# Patient Record
Sex: Female | Born: 1970 | Race: White | Hispanic: No | Marital: Married | Smoking: Current every day smoker
Health system: Southern US, Community
[De-identification: ages and names within clinical notes are randomized; demographics above are authoritative.]

## PROBLEM LIST (undated history)

## (undated) DIAGNOSIS — G709 Myoneural disorder, unspecified: Secondary | ICD-10-CM

## (undated) DIAGNOSIS — E785 Hyperlipidemia, unspecified: Secondary | ICD-10-CM

## (undated) DIAGNOSIS — E039 Hypothyroidism, unspecified: Secondary | ICD-10-CM

## (undated) DIAGNOSIS — I491 Atrial premature depolarization: Secondary | ICD-10-CM

## (undated) DIAGNOSIS — K219 Gastro-esophageal reflux disease without esophagitis: Secondary | ICD-10-CM

## (undated) DIAGNOSIS — I10 Essential (primary) hypertension: Secondary | ICD-10-CM

## (undated) DIAGNOSIS — J449 Chronic obstructive pulmonary disease, unspecified: Secondary | ICD-10-CM

## (undated) DIAGNOSIS — N838 Other noninflammatory disorders of ovary, fallopian tube and broad ligament: Secondary | ICD-10-CM

## (undated) DIAGNOSIS — J45909 Unspecified asthma, uncomplicated: Secondary | ICD-10-CM

## (undated) DIAGNOSIS — F419 Anxiety disorder, unspecified: Secondary | ICD-10-CM

## (undated) HISTORY — PX: TUBAL LIGATION: SHX77

## (undated) HISTORY — DX: Other noninflammatory disorders of ovary, fallopian tube and broad ligament: N83.8

## (undated) HISTORY — DX: Anxiety disorder, unspecified: F41.9

## (undated) HISTORY — DX: Essential (primary) hypertension: I10

## (undated) HISTORY — DX: Myoneural disorder, unspecified: G70.9

## (undated) HISTORY — DX: Hyperlipidemia, unspecified: E78.5

## (undated) HISTORY — PX: CHOLECYSTECTOMY: SHX55

## (undated) HISTORY — PX: PARTIAL HYSTERECTOMY: SHX80

## (undated) HISTORY — DX: Chronic obstructive pulmonary disease, unspecified: J44.9

## (undated) HISTORY — DX: Unspecified asthma, uncomplicated: J45.909

## (undated) HISTORY — DX: Atrial premature depolarization: I49.1

## (undated) HISTORY — DX: Gastro-esophageal reflux disease without esophagitis: K21.9

## (undated) HISTORY — DX: Hypothyroidism, unspecified: E03.9

---

## 2002-01-09 HISTORY — PX: UPPER GASTROINTESTINAL ENDOSCOPY: SHX188

## 2012-05-30 DIAGNOSIS — M940 Chondrocostal junction syndrome [Tietze]: Secondary | ICD-10-CM

## 2012-05-30 DIAGNOSIS — K219 Gastro-esophageal reflux disease without esophagitis: Secondary | ICD-10-CM

## 2012-05-30 DIAGNOSIS — I10 Essential (primary) hypertension: Secondary | ICD-10-CM | POA: Insufficient documentation

## 2012-05-30 DIAGNOSIS — R450 Nervousness: Secondary | ICD-10-CM

## 2012-05-30 DIAGNOSIS — M25569 Pain in unspecified knee: Secondary | ICD-10-CM

## 2012-05-30 DIAGNOSIS — R0689 Other abnormalities of breathing: Secondary | ICD-10-CM | POA: Insufficient documentation

## 2012-05-30 DIAGNOSIS — R0600 Dyspnea, unspecified: Secondary | ICD-10-CM

## 2012-05-30 DIAGNOSIS — E1142 Type 2 diabetes mellitus with diabetic polyneuropathy: Secondary | ICD-10-CM

## 2012-05-30 HISTORY — DX: Pain in unspecified knee: M25.569

## 2012-05-30 HISTORY — DX: Chondrocostal junction syndrome (tietze): M94.0

## 2012-05-30 HISTORY — DX: Gastro-esophageal reflux disease without esophagitis: K21.9

## 2012-05-30 HISTORY — DX: Type 2 diabetes mellitus with diabetic polyneuropathy: E11.42

## 2012-05-30 HISTORY — DX: Essential (primary) hypertension: I10

## 2012-05-30 HISTORY — DX: Dyspnea, unspecified: R06.00

## 2012-05-30 HISTORY — DX: Nervousness: R45.0

## 2012-05-30 HISTORY — DX: Dyspnea, unspecified: R06.89

## 2014-01-09 HISTORY — PX: COLONOSCOPY: SHX174

## 2014-04-24 DIAGNOSIS — G8929 Other chronic pain: Secondary | ICD-10-CM

## 2014-04-24 HISTORY — DX: Other chronic pain: G89.29

## 2014-04-27 DIAGNOSIS — M542 Cervicalgia: Secondary | ICD-10-CM

## 2014-04-27 HISTORY — DX: Cervicalgia: M54.2

## 2014-11-03 DIAGNOSIS — G250 Essential tremor: Secondary | ICD-10-CM | POA: Insufficient documentation

## 2014-11-03 HISTORY — DX: Essential tremor: G25.0

## 2015-10-07 ENCOUNTER — Other Ambulatory Visit: Payer: Self-pay | Admitting: Family Medicine

## 2015-10-07 DIAGNOSIS — R109 Unspecified abdominal pain: Secondary | ICD-10-CM

## 2015-10-12 ENCOUNTER — Ambulatory Visit
Admission: RE | Admit: 2015-10-12 | Discharge: 2015-10-12 | Disposition: A | Discharge status: Home / Self Care | Payer: PRIVATE HEALTH INSURANCE | Source: Ambulatory Visit | Attending: Family Medicine | Admitting: Family Medicine

## 2015-10-12 DIAGNOSIS — R109 Unspecified abdominal pain: Secondary | ICD-10-CM

## 2015-10-12 MED ORDER — IOPAMIDOL (ISOVUE-300) INJECTION 61%
100.0000 mL | Freq: Once | INTRAVENOUS | Status: AC | PRN
  Administered 2015-10-12: 100 mL via INTRAVENOUS

## 2015-10-25 HISTORY — PX: ESOPHAGOGASTRODUODENOSCOPY: SHX1529

## 2016-03-15 DIAGNOSIS — F17200 Nicotine dependence, unspecified, uncomplicated: Secondary | ICD-10-CM

## 2016-03-15 HISTORY — DX: Nicotine dependence, unspecified, uncomplicated: F17.200

## 2016-05-16 HISTORY — PX: OTHER SURGICAL HISTORY: SHX169

## 2016-07-06 DIAGNOSIS — Z0289 Encounter for other administrative examinations: Secondary | ICD-10-CM | POA: Insufficient documentation

## 2016-07-06 HISTORY — DX: Encounter for other administrative examinations: Z02.89

## 2016-12-28 HISTORY — DX: Morbid (severe) obesity due to excess calories: E66.01

## 2017-02-22 ENCOUNTER — Other Ambulatory Visit: Payer: Self-pay

## 2017-02-22 DIAGNOSIS — N838 Other noninflammatory disorders of ovary, fallopian tube and broad ligament: Secondary | ICD-10-CM | POA: Insufficient documentation

## 2017-02-22 DIAGNOSIS — E119 Type 2 diabetes mellitus without complications: Secondary | ICD-10-CM | POA: Insufficient documentation

## 2017-02-22 DIAGNOSIS — R002 Palpitations: Secondary | ICD-10-CM

## 2017-02-22 HISTORY — DX: Palpitations: R00.2

## 2017-02-22 HISTORY — DX: Type 2 diabetes mellitus without complications: E11.9

## 2017-02-28 ENCOUNTER — Encounter: Payer: Self-pay | Admitting: Cardiology

## 2017-02-28 ENCOUNTER — Other Ambulatory Visit: Payer: Self-pay

## 2017-02-28 ENCOUNTER — Ambulatory Visit (INDEPENDENT_AMBULATORY_CARE_PROVIDER_SITE_OTHER): Payer: PRIVATE HEALTH INSURANCE | Admitting: Cardiology

## 2017-02-28 VITALS — BP 128/72 | HR 76 | Ht 68.0 in | Wt 232.0 lb

## 2017-02-28 DIAGNOSIS — Z01810 Encounter for preprocedural cardiovascular examination: Secondary | ICD-10-CM | POA: Diagnosis not present

## 2017-02-28 DIAGNOSIS — R0609 Other forms of dyspnea: Secondary | ICD-10-CM

## 2017-02-28 DIAGNOSIS — F17200 Nicotine dependence, unspecified, uncomplicated: Secondary | ICD-10-CM | POA: Diagnosis not present

## 2017-02-28 DIAGNOSIS — R0600 Dyspnea, unspecified: Secondary | ICD-10-CM

## 2017-02-28 HISTORY — DX: Dyspnea, unspecified: R06.00

## 2017-02-28 HISTORY — DX: Other forms of dyspnea: R06.09

## 2017-02-28 HISTORY — DX: Encounter for preprocedural cardiovascular examination: Z01.810

## 2017-02-28 NOTE — Patient Instructions (Signed)
Medication Instructions:  Your physician recommends that you continue on your current medications as directed. Please refer to the Current Medication list given to you today.  Labwork: None  Testing/Procedures: You had an EKG today.  Your physician has requested that you have a lexiscan myoview. For further information please visit [email or web address]. Please follow instruction sheet, as given.  Follow-Up: Your physician recommends that you schedule a follow-up appointment as needed if symptoms worsen or fail to improve.  Any Other Special Instructions Will Be Listed Below (If Applicable).     If you need a refill on your cardiac medications before your next appointment, please call your pharmacy.

## 2017-02-28 NOTE — Progress Notes (Signed)
Cardiology Office Note:    Date:  02/28/2017   ID:  Debra Wolf, DOB 22-Mar-1970, MRN [id number]  PCP:  [facility]  Cardiologist:  Jenean Lindau, MD   Referring MD: Ronita Hipps, MD    ASSESSMENT:    1. Tobacco use disorder   2. Morbid obesity ([place])   3. Pre-operative cardiovascular examination   4. DOE (dyspnea on exertion)    PLAN:    In order of problems listed above:  1. Discussed my findings with the patient at extensive length.  Preop risk stratification philosophy was discussed with her.  In view of risk factors for coronary artery disease and a sedentary lifestyle we will obtain a Lexiscan sestamibi.  Her symptoms are atypical for coronary artery disease.  If this test is negative then she has not at high risk for coronary events during the aforementioned surgery.  Meticulous hemodynamic monitoring will further reduce the risk of coronary events. 2. She will be seen in follow-up appointment on a as needed basis only.  I mentioned to her that she needs to be evaluated attention her diet especially in terms of obesity and the risks for cardiovascular conditions.  She agrees and plans to be meticulous about her diet and weight loss.  Risks of obesity explained. 3. I spent 5 minutes with the patient discussing solely about smoking. Smoking cessation was counseled. I suggested to the patient also different medications and pharmacological interventions. Patient is keen to try stopping on its own at this time. He will get back to me if he needs any further assistance in this matter.   Medication Adjustments/Labs and Tests Ordered: Current medicines are reviewed at length with the patient today.  Concerns regarding medicines are outlined above.  Orders Placed This Encounter  Procedures  . Myocardial Perfusion Imaging  . EKG 12-Lead   No orders of the defined types were placed in this encounter.    History of Present Illness:    Debra Wolf is a 47 y.o.  female who is being seen today for the evaluation of preop cardiovascular evaluation at the request of Ronita Hipps, MD.  Patient is a pleasant 47 year old female.  She mentions to me that she is on disability because of significant tremor.  She ambulates with a cane.  She tells me of some chest tightness at times not related to exertion.  This is more like a flutter-like sensation.  No radiation to any part of the body and is been happening for the past several weeks.  No orthopnea or PND.  She does have some dyspnea on exertion.  She mentions to me that she has had a history of essential hypertension and diabetes mellitus but now she has been told by her primary care physician that it is much better now and she does not need medications for it.  At the time of my evaluation, the patient is alert awake oriented and in no distress.  She plans to undergo surgery and is here for assessment preoperatively.  Past Medical History:  Diagnosis Date  . Benign essential hypertension   . COPD (chronic obstructive pulmonary disease) ([place])   . Dyslipidemia   . GERD (gastroesophageal reflux disease)   . Hypothyroid   . Mass of ovary     Past Surgical History:  Procedure Laterality Date  . PARTIAL HYSTERECTOMY    . removal of deep brain stimulator  05/16/2016   Dr. Phylliss Blakes at [facility]    Current Medications: Current  Meds  Medication Sig  . albuterol (PROVENTIL HFA;VENTOLIN HFA) 108 (90 Base) MCG/ACT inhaler Inhale into the lungs.  . budesonide-formoterol (SYMBICORT) 160-4.5 MCG/ACT inhaler INHALE TWO PUFFS TWICE DAILY  . citalopram (CELEXA) 20 MG tablet Take by mouth.  . cyclobenzaprine (FLEXERIL) 10 MG tablet Take 10 mg by mouth.  . diclofenac sodium (VOLTAREN) 1 % GEL Apply topically.  . fluticasone (FLONASE) 50 MCG/ACT nasal spray Use 2 sprays in each nostril once daily  . furosemide (LASIX) 20 MG tablet TAKE ONE TABLET BY MOUTH ONCE DAILY AS NEEDED FOR SWELLING  . gabapentin (NEURONTIN) 300 MG  capsule Take 900 mg by mouth 3 (three) times daily.  Marland Kitchen HYDROcodone-acetaminophen (NORCO/VICODIN) 5-325 MG tablet Take by mouth.  . levothyroxine (SYNTHROID, LEVOTHROID) 150 MCG tablet Take 150 mcg by mouth daily.  Marland Kitchen levothyroxine (SYNTHROID, LEVOTHROID) 175 MCG tablet Take by mouth.  . nitroGLYCERIN (NITROSTAT) 0.4 MG SL tablet DISSOLVE 1 TABLET UNDER THE TONGUE EVERY 5 MINUTES AS NEEDED FOR CHEST PAIN. DO NOT EXCEED A TOTAL OF 3 DOSES IN 15 MINUTES.  . pantoprazole (PROTONIX) 40 MG tablet Take by mouth.  . Vitamin D, Ergocalciferol, (DRISDOL) 50000 units CAPS capsule TAKE ONE CAPSULE BY MOUTH ONCE A WEEK     Allergies:   Penicillins; Vancomycin; and Cephalexin   Social History   Socioeconomic History  . Marital status: Married    Spouse name: None  . Number of children: None  . Years of education: None  . Highest education level: None  Social Needs  . Financial resource strain: None  . Food insecurity - worry: None  . Food insecurity - inability: None  . Transportation needs - medical: None  . Transportation needs - non-medical: None  Occupational History  . None  Tobacco Use  . Smoking status: Current Every Day Smoker    Packs/day: 1.00  . Smokeless tobacco: Never Used  Substance and Sexual Activity  . Alcohol use: No    Frequency: Never  . Drug use: No  . Sexual activity: None  Other Topics Concern  . None  Social History Narrative  . None     Family History: The patient's family history includes Bladder Cancer in her mother; Lung cancer in her father; Ovarian cancer in her sister.  ROS:   Please see the history of present illness.    All other systems reviewed and are negative.  EKGs/Labs/Other Studies Reviewed:    The following studies were reviewed today: EKG reveals sinus rhythm and nonspecific ST-T changes other records were reviewed.   Recent Labs: No results found for requested labs within last 8760 hours.  Recent Lipid Panel No results found for:  CHOL, TRIG, HDL, CHOLHDL, VLDL, LDLCALC, LDLDIRECT  Physical Exam:    VS:  BP 128/72 (BP Location: Left Arm, Patient Position: Sitting, Cuff Size: Normal)   Pulse 76   Ht 5\' 8"  (1.727 m)   Wt 232 lb (105.2 kg)   SpO2 99%   BMI 35.28 kg/m     Wt Readings from Last 3 Encounters:  02/28/17 232 lb (105.2 kg)     GEN: Patient is in no acute distress HEENT: Normal NECK: No JVD; No carotid bruits LYMPHATICS: No lymphadenopathy CARDIAC: S1 S2 regular, 2/6 systolic murmur at the apex. RESPIRATORY:  Clear to auscultation without rales, wheezing or rhonchi  ABDOMEN: Soft, non-tender, non-distended MUSCULOSKELETAL:  No edema; No deformity  SKIN: Warm and dry NEUROLOGIC:  Alert and oriented x 3 PSYCHIATRIC:  Normal affect    Signed,  Jenean Lindau, MD  02/28/2017 1:58 PM    [facility]

## 2017-03-02 ENCOUNTER — Telehealth: Payer: Self-pay | Admitting: Cardiology

## 2017-03-02 DIAGNOSIS — Z01810 Encounter for preprocedural cardiovascular examination: Secondary | ICD-10-CM | POA: Diagnosis not present

## 2017-03-02 NOTE — Telephone Encounter (Signed)
Faxed to their office.

## 2017-03-02 NOTE — Telephone Encounter (Signed)
Patient needs results of CT also sent to [facility] in [place]: [place]. Their phone number is [phone]. They need it for surgery on Tuesday

## 2017-03-05 ENCOUNTER — Telehealth: Payer: Self-pay

## 2017-03-05 ENCOUNTER — Other Ambulatory Visit: Payer: Self-pay

## 2017-03-05 DIAGNOSIS — Z01810 Encounter for preprocedural cardiovascular examination: Secondary | ICD-10-CM

## 2017-03-05 DIAGNOSIS — R0609 Other forms of dyspnea: Secondary | ICD-10-CM

## 2017-03-05 NOTE — Telephone Encounter (Signed)
Patient called requesting the results of her cardiac testing at [facility]. Informed of results and that they were faxed to [facility] in [place] on Friday.

## 2017-07-08 DIAGNOSIS — E039 Hypothyroidism, unspecified: Secondary | ICD-10-CM | POA: Insufficient documentation

## 2017-07-08 DIAGNOSIS — J441 Chronic obstructive pulmonary disease with (acute) exacerbation: Secondary | ICD-10-CM | POA: Insufficient documentation

## 2017-07-08 DIAGNOSIS — R0902 Hypoxemia: Secondary | ICD-10-CM | POA: Insufficient documentation

## 2017-07-08 HISTORY — DX: Hypoxemia: R09.02

## 2017-07-08 HISTORY — DX: Chronic obstructive pulmonary disease with (acute) exacerbation: J44.1

## 2017-07-18 DIAGNOSIS — R079 Chest pain, unspecified: Secondary | ICD-10-CM

## 2017-07-18 HISTORY — DX: Chest pain, unspecified: R07.9

## 2017-07-19 ENCOUNTER — Other Ambulatory Visit: Payer: Self-pay

## 2017-07-19 DIAGNOSIS — I83893 Varicose veins of bilateral lower extremities with other complications: Secondary | ICD-10-CM

## 2017-10-17 ENCOUNTER — Encounter: Payer: Self-pay | Admitting: Vascular Surgery

## 2017-10-17 ENCOUNTER — Ambulatory Visit (HOSPITAL_COMMUNITY)
Admission: RE | Admit: 2017-10-17 | Discharge: 2017-10-17 | Disposition: A | Discharge status: Home / Self Care | Payer: 59 | Source: Ambulatory Visit | Attending: Vascular Surgery | Admitting: Vascular Surgery

## 2017-10-17 ENCOUNTER — Ambulatory Visit (INDEPENDENT_AMBULATORY_CARE_PROVIDER_SITE_OTHER): Payer: PRIVATE HEALTH INSURANCE | Admitting: Vascular Surgery

## 2017-10-17 VITALS — BP 136/75 | HR 60 | Temp 97.6°F | Resp 16 | Ht 68.0 in | Wt 257.0 lb

## 2017-10-17 DIAGNOSIS — I83893 Varicose veins of bilateral lower extremities with other complications: Secondary | ICD-10-CM

## 2017-10-17 DIAGNOSIS — I83813 Varicose veins of bilateral lower extremities with pain: Secondary | ICD-10-CM

## 2017-10-17 NOTE — Progress Notes (Signed)
REASON FOR CONSULT:    Bilateral painful varicose veins.  The consult is requested by Dr. Helene Kelp.  HPI:   Debra Wolf is a pleasant 47 y.o. female, who is referred with painful varicose veins of both lower extremities.  I have reviewed the records from the referring office.  The patient was seen on 07/05/2017 with leg swelling.  She had evidence of venous insufficiency and sent for vascular consultation.  Patient is also followed with COPD.  On my history, the patient describes pain in the hip and thigh which is described as burning which is aggravated by standing and relieved somewhat with elevation.  I do not get any classic history of claudication in either leg.  She denies any history of back pain.  She is had no previous history of DVT or phlebitis.  She has worked on her feet for many years but currently is out on disability but does spend a fair amount of time on her feet.  She has tried thigh-high compression stockings but these actually made her symptoms worse.  Past Medical History:  Diagnosis Date  . Benign essential hypertension   . COPD (chronic obstructive pulmonary disease) ([place])   . Dyslipidemia   . GERD (gastroesophageal reflux disease)   . Hypothyroid   . Mass of ovary     Family History  Problem Relation Age of Onset  . Bladder Cancer Mother   . Lung cancer Father   . Ovarian cancer Sister     SOCIAL HISTORY: Social History   Socioeconomic History  . Marital status: Married    Spouse name: Not on file  . Number of children: Not on file  . Years of education: Not on file  . Highest education level: Not on file  Occupational History  . Not on file  Social Needs  . Financial resource strain: Not on file  . Food insecurity:    Worry: Not on file    Inability: Not on file  . Transportation needs:    Medical: Not on file    Non-medical: Not on file  Tobacco Use  . Smoking status: Current Every Day Smoker    Packs/day: 1.00  . Smokeless tobacco: Never  Used  Substance and Sexual Activity  . Alcohol use: No    Frequency: Never  . Drug use: No  . Sexual activity: Not on file  Lifestyle  . Physical activity:    Days per week: Not on file    Minutes per session: Not on file  . Stress: Not on file  Relationships  . Social connections:    Talks on phone: Not on file    Gets together: Not on file    Attends religious service: Not on file    Active member of club or organization: Not on file    Attends meetings of clubs or organizations: Not on file    Relationship status: Not on file  . Intimate partner violence:    Fear of current or ex partner: Not on file    Emotionally abused: Not on file    Physically abused: Not on file    Forced sexual activity: Not on file  Other Topics Concern  . Not on file  Social History Narrative  . Not on file    Allergies  Allergen Reactions  . Penicillins Hives and Swelling    Tolerated amoxicillin course in 2014 Tolerated amoxicillin course in 2014   . Vancomycin Anaphylaxis  . Cephalexin Itching    Current  Outpatient Medications  Medication Sig Dispense Refill  . albuterol (PROVENTIL HFA;VENTOLIN HFA) 108 (90 Base) MCG/ACT inhaler Inhale into the lungs.    . budesonide-formoterol (SYMBICORT) 160-4.5 MCG/ACT inhaler INHALE TWO PUFFS TWICE DAILY    . citalopram (CELEXA) 20 MG tablet Take by mouth.    . cyclobenzaprine (FLEXERIL) 10 MG tablet Take 10 mg by mouth.    . diclofenac sodium (VOLTAREN) 1 % GEL Apply topically.    . fluticasone (FLONASE) 50 MCG/ACT nasal spray Use 2 sprays in each nostril once daily    . furosemide (LASIX) 20 MG tablet TAKE ONE TABLET BY MOUTH ONCE DAILY AS NEEDED FOR SWELLING    . gabapentin (NEURONTIN) 300 MG capsule Take 900 mg by mouth 3 (three) times daily.    Marland Kitchen levothyroxine (SYNTHROID, LEVOTHROID) 150 MCG tablet Take 150 mcg by mouth daily.  2  . nitroGLYCERIN (NITROSTAT) 0.4 MG SL tablet DISSOLVE 1 TABLET UNDER THE TONGUE EVERY 5 MINUTES AS NEEDED FOR  CHEST PAIN. DO NOT EXCEED A TOTAL OF 3 DOSES IN 15 MINUTES.    . pantoprazole (PROTONIX) 40 MG tablet Take by mouth.    Marland Kitchen HYDROcodone-acetaminophen (NORCO/VICODIN) 5-325 MG tablet Take by mouth.    . levothyroxine (SYNTHROID, LEVOTHROID) 175 MCG tablet Take by mouth.    . Vitamin D, Ergocalciferol, (DRISDOL) 50000 units CAPS capsule TAKE ONE CAPSULE BY MOUTH ONCE A WEEK     No current facility-administered medications for this visit.     REVIEW OF SYSTEMS:  [X]  denotes positive finding, [ ]  denotes negative finding Cardiac  Comments:  Chest pain or chest pressure:    Shortness of breath upon exertion: x   Short of breath when lying flat:    Irregular heart rhythm:        Vascular    Pain in calf, thigh, or hip brought on by ambulation: x   Pain in feet at night that wakes you up from your sleep:  x   Blood clot in your veins:    Leg swelling:  x       Pulmonary    Oxygen at home:    Productive cough:     Wheezing:  x       Neurologic    Sudden weakness in arms or legs:     Sudden numbness in arms or legs:     Sudden onset of difficulty speaking or slurred speech:    Temporary loss of vision in one eye:     Problems with dizziness:         Gastrointestinal    Blood in stool:     Vomited blood:         Genitourinary    Burning when urinating:     Blood in urine:        Psychiatric    Major depression:         Hematologic    Bleeding problems:    Problems with blood clotting too easily:        Skin    Rashes or ulcers:        Constitutional    Fever or chills:     PHYSICAL EXAM:   Vitals:   10/17/17 1234  BP: 136/75  Pulse: 60  Resp: 16  Temp: 97.6 F (36.4 C)  SpO2: 99%  Weight: 257 lb (116.6 kg)  Height: 5\' 8"  (1.727 m)    GENERAL: The patient is a well-nourished female, in no acute distress. The vital signs are documented  above. CARDIAC: There is a regular rate and rhythm.  VASCULAR: I do not detect carotid bruits. She has palpable femoral and  dorsalis pedis pulses bilaterally. She has biphasic dorsalis pedis and posterior tibial signals bilaterally. She has mild bilateral lower extremity swelling with some spider veins bilaterally.  There is no significant hyperpigmentation. PULMONARY: There is good air exchange bilaterally without wheezing or rales. ABDOMEN: Soft and non-tender with normal pitched bowel sounds.  MUSCULOSKELETAL: There are no major deformities or cyanosis. NEUROLOGIC: No focal weakness or paresthesias are detected. SKIN: There are no ulcers or rashes noted. PSYCHIATRIC: The patient has a normal affect.  DATA:    VENOUS DUPLEX: I have independently interpreted her venous duplex scan today.  On the right side there is no evidence of DVT or superficial thrombophlebitis.  There is no significant deep venous reflux.  There is reflux in the great saphenous vein in the proximal calf to the distal thigh.  The vein is moderately dilated here.  There is incompetence of the saphenofemoral junction.  On the left side there is no evidence of DVT or superficial thrombophlebitis.  There is no significant reflux in the superficial venous system on the left.  There is no significant deep venous reflux.   ASSESSMENT & PLAN:   CHRONIC VENOUS INSUFFICIENCY: I reassured the patient that she has no evidence of arterial insufficiency.  I think her symptoms are partly related to her venous insufficiency.  The burning pain she describes with standing and the aching heavy feeling in her leg certainly could fit with venous disease.  However some of her pain radiates to her hip is likely unrelated to this.  Regardless we have discussed conservative treatment for her chronic venous insufficiency.  We have discussed the importance of intermittent leg elevation the proper positioning for this.  I have written her a prescription for knee-high compression stockings with a gradient of 15 to 20 mmHg.  I have encouraged her to avoid prolonged sitting  and standing.  I encouraged her to exercise.  Specifically walking and water aerobics are especially helpful.  We have also discussed the importance of weight management.  If her symptoms progress in the future then certainly we could repeat her duplex.  We could also consider thigh-high stockings with a titer gradient.  I will see her back as needed.   Deitra Mayo [facility] [phone]

## 2019-02-03 ENCOUNTER — Other Ambulatory Visit: Payer: Self-pay

## 2019-02-03 ENCOUNTER — Ambulatory Visit (INDEPENDENT_AMBULATORY_CARE_PROVIDER_SITE_OTHER): Payer: PRIVATE HEALTH INSURANCE | Admitting: Cardiology

## 2019-02-03 ENCOUNTER — Encounter: Payer: Self-pay | Admitting: Cardiology

## 2019-02-03 ENCOUNTER — Ambulatory Visit (INDEPENDENT_AMBULATORY_CARE_PROVIDER_SITE_OTHER): Payer: PRIVATE HEALTH INSURANCE

## 2019-02-03 VITALS — BP 120/80 | HR 77 | Ht 68.0 in | Wt 244.0 lb

## 2019-02-03 DIAGNOSIS — R0600 Dyspnea, unspecified: Secondary | ICD-10-CM

## 2019-02-03 DIAGNOSIS — R002 Palpitations: Secondary | ICD-10-CM

## 2019-02-03 DIAGNOSIS — I10 Essential (primary) hypertension: Secondary | ICD-10-CM | POA: Diagnosis not present

## 2019-02-03 MED ORDER — NITROGLYCERIN 0.4 MG SL SUBL: SUBLINGUAL_TABLET | SUBLINGUAL | 3 refills | Status: DC

## 2019-02-03 MED ORDER — FUROSEMIDE 20 MG PO TABS: 20.0000 mg | ORAL_TABLET | Freq: Every day | ORAL | 1 refills | Status: DC

## 2019-02-03 MED ORDER — POTASSIUM CHLORIDE CRYS ER 20 MEQ PO TBCR: 20.0000 meq | EXTENDED_RELEASE_TABLET | Freq: Every day | ORAL | 1 refills | Status: DC

## 2019-02-03 NOTE — Patient Instructions (Signed)
Medication Instructions:  Your physician has recommended you make the following change in your medication:  START: Furosemide 20 mg Take 1 tab daily START: Potassium 20 meq Take 1 tab daily  Nitroglycerin was refilled  *If you need a refill on your cardiac medications before your next appointment, please call your pharmacy*  Lab Work: Your physician recommends that you return for lab work in: TODAY BMP,CBC,TSH,Magnesium  If you have labs (blood work) drawn today and your tests are completely normal, you will receive your results only by: Marland Kitchen MyChart Message (if you have MyChart) OR . A paper copy in the mail If you have any lab test that is abnormal or we need to change your treatment, we will call you to review the results.  Testing/Procedures: A zio monitor was ordered today. It will remain on for 7 days. You will then return monitor and event diary in provided box. It takes 1-2 weeks for report to be downloaded and returned to [place]. We will call you with the results. If monitor falls off or has orange flashing light, please call [employer] for further instructions.   Your physician has requested that you have an echocardiogram. Echocardiography is a painless test that uses sound waves to create images of your heart. It provides your doctor with information about the size and shape of your heart and how well your heart's chambers and valves are working. This procedure takes approximately one hour. There are no restrictions for this procedure.    Follow-Up: At [facility], you and your health needs are our priority.  As part of our continuing mission to provide you with exceptional heart care, we have created designated Provider Care Teams.  These Care Teams include your primary Cardiologist (physician) and Advanced Practice Providers (APPs -  Physician Assistants and Nurse Practitioners) who all work together to provide you with the care you need, when you need it.  Your next appointment:   1  month(s)  The format for your next appointment:   In Person  Provider:   Berniece Salines, DO  Other Instructions

## 2019-02-03 NOTE — Progress Notes (Signed)
Cardiology Office Note:    Date:  02/03/2019   ID:  Debra Wolf, DOB March 05, 1970, MRN [id number]  PCP:  [facility]  Cardiologist:  Berniece Salines, DO  Electrophysiologist:  None   Referring MD: Ronita Hipps, MD   Chief Complaint  Patient presents with  . Chest Pain  Patient was referred for chest pain shortness of breath.  History of Present Illness:    Debra Wolf is a 49 y.o. female with a hx of hypertension, dyslipidemia, hypothyroidism presents today to be evaluated for shortness of breath, palpitations and chest pain.  Patient tells me that over the last few weeks she has been experiencing significant shortness of breath on exertion.  She notes that she is unable to do her daily activities as she gets short of breath and has to stop any activity to be able to catch her breath at rest.  She states that few weeks ago she was experiencing right shoulder pain which she was told that this was rotator cuff but now she has been having left shoulder pain.  She does not really express any characteristics of a certain chest pain but just notes that the pain is actually in her left shoulder.  In addition what she tells me is that she has been experiencing significant palpitations.  She describes the palpitations abrupt onset of fast heartbeat which lasts for about 2 to 3 hours on onset.  She notes during this time she is very tired and feel that her shortness of breath get worse and he does have some lightheadedness.  After this episode she tells me that she significantly feels as if she has no energy.  Past Medical History:  Diagnosis Date  . Benign essential hypertension   . COPD (chronic obstructive pulmonary disease) ([place])   . Dyslipidemia   . GERD (gastroesophageal reflux disease)   . Hypothyroid   . Mass of ovary     Past Surgical History:  Procedure Laterality Date  . PARTIAL HYSTERECTOMY    . removal of deep brain stimulator  05/16/2016   Dr. Phylliss Blakes at [facility]     Current Medications: Current Meds  Medication Sig  . albuterol (PROVENTIL HFA;VENTOLIN HFA) 108 (90 Base) MCG/ACT inhaler Inhale into the lungs.  . budesonide-formoterol (SYMBICORT) 160-4.5 MCG/ACT inhaler INHALE TWO PUFFS TWICE DAILY  . cyclobenzaprine (FLEXERIL) 10 MG tablet Take 10 mg by mouth.  . fluticasone (FLONASE) 50 MCG/ACT nasal spray Use 2 sprays in each nostril once daily  . levothyroxine (SYNTHROID, LEVOTHROID) 150 MCG tablet Take 150 mcg by mouth daily.  . nitroGLYCERIN (NITROSTAT) 0.4 MG SL tablet DISSOLVE 1 TABLET UNDER THE TONGUE EVERY 5 MINUTES AS NEEDED FOR CHEST PAIN. DO NOT EXCEED A TOTAL OF 3 DOSES IN 15 MINUTES.  . pantoprazole (PROTONIX) 40 MG tablet Take by mouth.  . Vitamin D, Ergocalciferol, (DRISDOL) 50000 units CAPS capsule TAKE ONE CAPSULE BY MOUTH ONCE A WEEK  . [DISCONTINUED] furosemide (LASIX) 20 MG tablet TAKE ONE TABLET BY MOUTH ONCE DAILY AS NEEDED FOR SWELLING  . [DISCONTINUED] nitroGLYCERIN (NITROSTAT) 0.4 MG SL tablet DISSOLVE 1 TABLET UNDER THE TONGUE EVERY 5 MINUTES AS NEEDED FOR CHEST PAIN. DO NOT EXCEED A TOTAL OF 3 DOSES IN 15 MINUTES.     Allergies:   Penicillins, Vancomycin, and Cephalexin   Social History   Socioeconomic History  . Marital status: Married    Spouse name: Not on file  . Number of children: Not on file  . Years of  education: Not on file  . Highest education level: Not on file  Occupational History  . Not on file  Tobacco Use  . Smoking status: Current Every Day Smoker    Packs/day: 1.00  . Smokeless tobacco: Never Used  Substance and Sexual Activity  . Alcohol use: No  . Drug use: No  . Sexual activity: Not on file  Other Topics Concern  . Not on file  Social History Narrative  . Not on file   Social Determinants of Health   Financial Resource Strain:   . Difficulty of Paying Living Expenses: Not on file  Food Insecurity:   . Worried About Charity fundraiser in the Last Year: Not on file  . Ran Out of  Food in the Last Year: Not on file  Transportation Needs:   . Lack of Transportation (Medical): Not on file  . Lack of Transportation (Non-Medical): Not on file  Physical Activity:   . Days of Exercise per Week: Not on file  . Minutes of Exercise per Session: Not on file  Stress:   . Feeling of Stress : Not on file  Social Connections:   . Frequency of Communication with Friends and Family: Not on file  . Frequency of Social Gatherings with Friends and Family: Not on file  . Attends Religious Services: Not on file  . Active Member of Clubs or Organizations: Not on file  . Attends Archivist Meetings: Not on file  . Marital Status: Not on file     Family History: The patient's family history includes Bladder Cancer in her mother; Congestive Heart Failure in her father and mother; Lung cancer in her father; Ovarian cancer in her sister.  ROS:   Review of Systems  Constitution: Negative for decreased appetite, fever and weight gain.  HENT: Negative for congestion, ear discharge, hoarse voice and sore throat.   Eyes: Negative for discharge, redness, vision loss in right eye and visual halos.  Cardiovascular: Reports chest pain, dyspnea on exertion and palpitations.  Negative for, leg swelling, orthopnea. Respiratory: Negative for cough, hemoptysis, shortness of breath and snoring.   Endocrine: Negative for heat intolerance and polyphagia.  Hematologic/Lymphatic: Negative for bleeding problem. Does not bruise/bleed easily.  Skin: Negative for flushing, nail changes, rash and suspicious lesions.  Musculoskeletal: Negative for arthritis, joint pain, muscle cramps, myalgias, neck pain and stiffness.  Gastrointestinal: Negative for abdominal pain, bowel incontinence, diarrhea and excessive appetite.  Genitourinary: Negative for decreased libido, genital sores and incomplete emptying.  Neurological: Negative for brief paralysis, focal weakness, headaches and loss of balance.   Psychiatric/Behavioral: Negative for altered mental status, depression and suicidal ideas.  Allergic/Immunologic: Negative for HIV exposure and persistent infections.    EKGs/Labs/Other Studies Reviewed:    The following studies were reviewed today:   EKG:  The ekg ordered today demonstrates sinus rhythm, heart rate 79 bpm, prior EKG for comparison.  In June 2019 the patient did undergo her echocardiogram with [facility] which reported normal LVEF 55 to 60%.  Trace mitral vegetation.  Normal left atrium.  Normal right atrium.  Normal right ventricular function.  No aortic stenosis or regurgitation.  Pharmacologic nuclear stress test done in February 2019 at [facility] was reported as normal with no reversible ischemia or infarction.  Recent Labs: No results found for requested labs within last 8760 hours.  Recent Lipid Panel No results found for: CHOL, TRIG, HDL, CHOLHDL, VLDL, LDLCALC, LDLDIRECT  Physical Exam:    VS:  BP 120/80 (BP Location: Left Arm, Patient Position: Sitting, Cuff Size: Large)   Pulse 77   Ht 5\' 8"  (1.727 m)   Wt 244 lb (110.7 kg)   SpO2 93%   BMI 37.10 kg/m     Wt Readings from Last 3 Encounters:  02/03/19 244 lb (110.7 kg)  10/17/17 257 lb (116.6 kg)  02/28/17 232 lb (105.2 kg)     GEN: Well nourished, well developed in no acute distress HEENT: Normal NECK: No JVD; No carotid bruits LYMPHATICS: No lymphadenopathy CARDIAC: S1S2 noted,RRR, no murmurs, rubs, gallops RESPIRATORY:  Clear to auscultation without rales, wheezing or rhonchi  ABDOMEN: Soft, non-tender, non-distended, +bowel sounds, no guarding. EXTREMITIES: No edema, No cyanosis, no clubbing MUSCULOSKELETAL:  No edema; No deformity  SKIN: Warm and dry NEUROLOGIC:  Alert and oriented x 3, non-focal PSYCHIATRIC:  Normal affect, good insight  ASSESSMENT:    1. Dyspnea, unspecified type   2. Palpitations   3. Essential hypertension    PLAN:    1.  I would like to  rule out a cardiovascular etiology of this palpitation, therefore at this time I would like to placed a zio patch for 7 days. In additon a transthoracic echocardiogram will be ordered to assess LV/RV function and any structural abnormalities. Once these testing have been performed amd reviewed further reccomendations will be made. For now, I do reccomend that the patient goes to the nearest ED if  symptoms recur.  2.  Also concerned that diastolic dysfunction may be playing a role.  Therefore I am going to give the patient Lasix 20 mg daily with potassium chloride 20 mEq daily.  Hopefully this will help with her symptoms.  3.  I was able to review her pharmacologic stress test which was done in February 2019-this test was reported as normal.  Therefore now.  Nitroglycerin will be ordered and I am hoping that we are able to understand the etiology of her symptoms from the monitor as well as her echocardiogram.  For now we will hold off on redoing a stress test and reassess clinically after other testing have been done.  4.  Blood work will be done today for BMP, CBC, TSH.  The patient is in agreement with the above plan. The patient left the office in stable condition.  The patient will follow up in 1 month or sooner if needed   Medication Adjustments/Labs and Tests Ordered: Current medicines are reviewed at length with the patient today.  Concerns regarding medicines are outlined above.  Orders Placed This Encounter  Procedures  . Basic Metabolic Panel (BMET)  . Magnesium  . CBC  . TSH  . LONG TERM MONITOR (3-14 DAYS)  . EKG 12-Lead  . ECHOCARDIOGRAM COMPLETE   Meds ordered this encounter  Medications  . furosemide (LASIX) 20 MG tablet    Sig: Take 1 tablet (20 mg total) by mouth daily.    Dispense:  90 tablet    Refill:  1  . potassium chloride SA (KLOR-CON) 20 MEQ tablet    Sig: Take 1 tablet (20 mEq total) by mouth daily.    Dispense:  90 tablet    Refill:  1  . nitroGLYCERIN  (NITROSTAT) 0.4 MG SL tablet    Sig: DISSOLVE 1 TABLET UNDER THE TONGUE EVERY 5 MINUTES AS NEEDED FOR CHEST PAIN. DO NOT EXCEED A TOTAL OF 3 DOSES IN 15 MINUTES.    Dispense:  30 tablet    Refill:  3  Patient Instructions  Medication Instructions:  Your physician has recommended you make the following change in your medication:  START: Furosemide 20 mg Take 1 tab daily START: Potassium 20 meq Take 1 tab daily  Nitroglycerin was refilled  *If you need a refill on your cardiac medications before your next appointment, please call your pharmacy*  Lab Work: Your physician recommends that you return for lab work in: TODAY BMP,CBC,TSH,Magnesium  If you have labs (blood work) drawn today and your tests are completely normal, you will receive your results only by: Marland Kitchen MyChart Message (if you have MyChart) OR . A paper copy in the mail If you have any lab test that is abnormal or we need to change your treatment, we will call you to review the results.  Testing/Procedures: A zio monitor was ordered today. It will remain on for 7 days. You will then return monitor and event diary in provided box. It takes 1-2 weeks for report to be downloaded and returned to [place]. We will call you with the results. If monitor falls off or has orange flashing light, please call Zio for further instructions.   Your physician has requested that you have an echocardiogram. Echocardiography is a painless test that uses sound waves to create images of your heart. It provides your doctor with information about the size and shape of your heart and how well your heart's chambers and valves are working. This procedure takes approximately one hour. There are no restrictions for this procedure.    Follow-Up: At [facility], you and your health needs are our priority.  As part of our continuing mission to provide you with exceptional heart care, we have created designated Provider Care Teams.  These Care Teams include  your primary Cardiologist (physician) and Advanced Practice Providers (APPs -  Physician Assistants and Nurse Practitioners) who all work together to provide you with the care you need, when you need it.  Your next appointment:   1 month(s)  The format for your next appointment:   In Person  Provider:   Berniece Salines, DO  Other Instructions      Adopting a Healthy Lifestyle.  Know what a healthy weight is for you (roughly BMI <25) and aim to maintain this   Aim for 7+ servings of fruits and vegetables daily   65-80+ fluid ounces of water or unsweet tea for healthy kidneys   Limit to max 1 drink of alcohol per day; avoid smoking/tobacco   Limit animal fats in diet for cholesterol and heart health - choose grass fed whenever available   Avoid highly processed foods, and foods high in saturated/trans fats   Aim for low stress - take time to unwind and care for your mental health   Aim for 150 min of moderate intensity exercise weekly for heart health, and weights twice weekly for bone health   Aim for 7-9 hours of sleep daily   When it comes to diets, agreement about the perfect plan isnt easy to find, even among the experts. Experts at the [place] developed an idea known as the Healthy Eating Plate. Just imagine a plate divided into logical, healthy portions.   The emphasis is on diet quality:   Load up on vegetables and fruits - one-half of your plate: Aim for color and variety, and remember that potatoes dont count.   Go for whole grains - one-quarter of your plate: Whole wheat, barley, wheat berries, quinoa, oats, brown rice, and foods  made with them. If you want pasta, go with whole wheat pasta.   Protein power - one-quarter of your plate: Fish, chicken, beans, and nuts are all healthy, versatile protein sources. Limit red meat.   The diet, however, does go beyond the plate, offering a few other suggestions.   Use healthy plant oils, such as  olive, canola, soy, corn, sunflower and peanut. Check the labels, and avoid partially hydrogenated oil, which have unhealthy trans fats.   If youre thirsty, drink water. Coffee and tea are good in moderation, but skip sugary drinks and limit milk and dairy products to one or two daily servings.   The type of carbohydrate in the diet is more important than the amount. Some sources of carbohydrates, such as vegetables, fruits, whole grains, and beans-are healthier than others.   Finally, stay active  Signed, Berniece Salines, DO  02/03/2019 3:52 PM    [facility]

## 2019-02-04 ENCOUNTER — Ambulatory Visit: Payer: 59 | Admitting: Cardiology

## 2019-02-04 LAB — BASIC METABOLIC PANEL
BUN/Creatinine Ratio: 11 (ref 9–23)
BUN: 11 mg/dL (ref 6–24)
CO2: 26 mmol/L (ref 20–29)
Calcium: 9.4 mg/dL (ref 8.7–10.2)
Chloride: 99 mmol/L (ref 96–106)
Creatinine, Ser: 1 mg/dL (ref 0.57–1.00)
GFR calc Af Amer: 76 mL/min/{1.73_m2} (ref >59)
GFR calc non Af Amer: 66 mL/min/{1.73_m2} (ref >59)
Glucose: 83 mg/dL (ref 65–99)
Potassium: 4.5 mmol/L (ref 3.5–5.2)
Sodium: 141 mmol/L (ref 134–144)

## 2019-02-04 LAB — MAGNESIUM: Magnesium: 2.3 mg/dL (ref 1.6–2.3)

## 2019-02-04 LAB — CBC
Hematocrit: 40.6 % (ref 34.0–46.6)
Hemoglobin: 13.5 g/dL (ref 11.1–15.9)
MCH: 28.8 pg (ref 26.6–33.0)
MCHC: 33.3 g/dL (ref 31.5–35.7)
MCV: 87 fL (ref 79–97)
Platelets: 286 10*3/uL (ref 150–450)
RBC: 4.68 x10E6/uL (ref 3.77–5.28)
RDW: 13.3 % (ref 11.7–15.4)
WBC: 12.2 10*3/uL — ABNORMAL HIGH (ref 3.4–10.8)

## 2019-02-04 LAB — TSH: TSH: 0.581 u[IU]/mL (ref 0.450–4.500)

## 2019-02-05 ENCOUNTER — Telehealth: Payer: Self-pay | Admitting: *Deleted

## 2019-02-05 NOTE — Telephone Encounter (Signed)
-----   Message from Berniece Salines, DO sent at 02/05/2019  8:31 AM EST ----- Mildly elevated WBC.  Otherwise normal labs

## 2019-02-05 NOTE — Telephone Encounter (Signed)
Telephone call to patient. Left message with lab results and to call with any questions.

## 2019-02-05 NOTE — Telephone Encounter (Signed)
The patient has been notified of the result and verbalized understanding.  All questions (if any) were answered. Antonieta Iba, RN 02/05/2019 2:27 PM

## 2019-02-05 NOTE — Telephone Encounter (Signed)
Patient returning call.

## 2019-02-05 NOTE — Telephone Encounter (Signed)
Telephone call to patient. She could not hear the message so I informed her of her lab results again. No further questions.

## 2019-02-27 ENCOUNTER — Telehealth: Payer: Self-pay | Admitting: *Deleted

## 2019-02-27 NOTE — Telephone Encounter (Signed)
Patient informed. Copy sent to PCP °

## 2019-02-27 NOTE — Telephone Encounter (Signed)
-----   Message from Berniece Salines, DO sent at 02/26/2019 11:16 PM EST ----- Please see if the patient can come earlier than her April appointment to discuss her monitor results.

## 2019-02-28 ENCOUNTER — Other Ambulatory Visit: Payer: PRIVATE HEALTH INSURANCE

## 2019-03-03 ENCOUNTER — Other Ambulatory Visit: Payer: Self-pay

## 2019-03-03 ENCOUNTER — Encounter: Payer: Self-pay | Admitting: Cardiology

## 2019-03-03 ENCOUNTER — Ambulatory Visit (INDEPENDENT_AMBULATORY_CARE_PROVIDER_SITE_OTHER): Payer: PRIVATE HEALTH INSURANCE | Admitting: Cardiology

## 2019-03-03 VITALS — BP 128/70 | HR 64 | Ht 68.0 in | Wt 246.0 lb

## 2019-03-03 DIAGNOSIS — I10 Essential (primary) hypertension: Secondary | ICD-10-CM | POA: Diagnosis not present

## 2019-03-03 DIAGNOSIS — Z720 Tobacco use: Secondary | ICD-10-CM | POA: Diagnosis not present

## 2019-03-03 DIAGNOSIS — E782 Mixed hyperlipidemia: Secondary | ICD-10-CM

## 2019-03-03 DIAGNOSIS — I491 Atrial premature depolarization: Secondary | ICD-10-CM

## 2019-03-03 HISTORY — DX: Mixed hyperlipidemia: E78.2

## 2019-03-03 MED ORDER — METOPROLOL SUCCINATE ER 25 MG PO TB24: 12.5000 mg | ORAL_TABLET | Freq: Every day | ORAL | 1 refills | Status: DC

## 2019-03-03 NOTE — Progress Notes (Signed)
Cardiology Office Note:    Date:  03/03/2019   ID:  Debra Wolf, DOB [birth year], MRN [id number]  PCP:  [facility]  Cardiologist:  Berniece Salines, DO  Electrophysiologist:  None   Referring MD: [facility]   Follow up visit to discuss monitor.  History of Present Illness:    Debra Wolf is a 49 y.o. female with a hx of hypertension, hyperlipidemia initially presented on February 03, 2019 to be evaluated for shortness of breath and palpitations.  During her visit I was able to review the patient echocardiogram done in June 2019 at that time her LVEF was 55 to 60% with trace mitral regurgitation.  Therefore the study was not repeated.  I also reviewed her February 2019 pharmacologic stress test done at [facility].  ZIO monitor was ordered and patient is here to discuss result.  Past Medical History:  Diagnosis Date  . Benign essential hypertension   . COPD (chronic obstructive pulmonary disease) ([place])   . Dyslipidemia   . GERD (gastroesophageal reflux disease)   . Hypothyroid   . Mass of ovary     Past Surgical History:  Procedure Laterality Date  . PARTIAL HYSTERECTOMY    . removal of deep brain stimulator  05/16/2016   Dr. Phylliss Blakes at [facility]    Current Medications: Current Meds  Medication Sig  . albuterol (PROVENTIL HFA;VENTOLIN HFA) 108 (90 Base) MCG/ACT inhaler Inhale into the lungs.  . budesonide-formoterol (SYMBICORT) 160-4.5 MCG/ACT inhaler INHALE TWO PUFFS TWICE DAILY  . cyclobenzaprine (FLEXERIL) 10 MG tablet Take 10 mg by mouth.  . fluticasone (FLONASE) 50 MCG/ACT nasal spray Use 2 sprays in each nostril once daily  . furosemide (LASIX) 20 MG tablet Take 1 tablet (20 mg total) by mouth daily.  Marland Kitchen levothyroxine (SYNTHROID, LEVOTHROID) 150 MCG tablet Take 150 mcg by mouth daily.  . nitroGLYCERIN (NITROSTAT) 0.4 MG SL tablet DISSOLVE 1 TABLET UNDER THE TONGUE EVERY 5 MINUTES AS NEEDED FOR CHEST PAIN. DO NOT EXCEED A TOTAL OF 3 DOSES  IN 15 MINUTES.  . pantoprazole (PROTONIX) 40 MG tablet Take by mouth.  . potassium chloride SA (KLOR-CON) 20 MEQ tablet Take 1 tablet (20 mEq total) by mouth daily.  . Vitamin D, Ergocalciferol, (DRISDOL) 50000 units CAPS capsule TAKE ONE CAPSULE BY MOUTH ONCE A WEEK     Allergies:   Penicillins, Vancomycin, and Cephalexin   Social History   Socioeconomic History  . Marital status: Married    Spouse name: Not on file  . Number of children: Not on file  . Years of education: Not on file  . Highest education level: Not on file  Occupational History  . Not on file  Tobacco Use  . Smoking status: Current Every Day Smoker    Packs/day: 1.00  . Smokeless tobacco: Never Used  Substance and Sexual Activity  . Alcohol use: No  . Drug use: No  . Sexual activity: Not on file  Other Topics Concern  . Not on file  Social History Narrative  . Not on file   Social Determinants of Health   Financial Resource Strain:   . Difficulty of Paying Living Expenses: Not on file  Food Insecurity:   . Worried About Charity fundraiser in the Last Year: Not on file  . Ran Out of Food in the Last Year: Not on file  Transportation Needs:   . Lack of Transportation (Medical): Not on file  . Lack of Transportation (Non-Medical): Not  on file  Physical Activity:   . Days of Exercise per Week: Not on file  . Minutes of Exercise per Session: Not on file  Stress:   . Feeling of Stress : Not on file  Social Connections:   . Frequency of Communication with Friends and Family: Not on file  . Frequency of Social Gatherings with Friends and Family: Not on file  . Attends Religious Services: Not on file  . Active Member of Clubs or Organizations: Not on file  . Attends Archivist Meetings: Not on file  . Marital Status: Not on file     Family History: The patient's family history includes Bladder Cancer in her mother; Congestive Heart Failure in her father and mother; Lung cancer in her  father; Ovarian cancer in her sister.  ROS:   Review of Systems  Constitution: Negative for decreased appetite, fever and weight gain.  HENT: Negative for congestion, ear discharge, hoarse voice and sore throat.   Eyes: Negative for discharge, redness, vision loss in right eye and visual halos.  Cardiovascular: Negative for chest pain, dyspnea on exertion, leg swelling, orthopnea and palpitations.  Respiratory: Negative for cough, hemoptysis, shortness of breath and snoring.   Endocrine: Negative for heat intolerance and polyphagia.  Hematologic/Lymphatic: Negative for bleeding problem. Does not bruise/bleed easily.  Skin: Negative for flushing, nail changes, rash and suspicious lesions.  Musculoskeletal: Negative for arthritis, joint pain, muscle cramps, myalgias, neck pain and stiffness.  Gastrointestinal: Negative for abdominal pain, bowel incontinence, diarrhea and excessive appetite.  Genitourinary: Negative for decreased libido, genital sores and incomplete emptying.  Neurological: Negative for brief paralysis, focal weakness, headaches and loss of balance.  Psychiatric/Behavioral: Negative for altered mental status, depression and suicidal ideas.  Allergic/Immunologic: Negative for HIV exposure and persistent infections.    EKGs/Labs/Other Studies Reviewed:    The following studies were reviewed today:   EKG:  None today.  ZIO monitor The patient wore the monitor for 6 days 17 hours starting February 03, 2019. Indication: Palpitations The minimum heart rate was 43 bpm, maximum heart rate was 121 bpm, and average heart rate was 71 bpm. Predominant underlying rhythm was Sinus Rhythm with some episodes of ectopic atrial rhythm.  Premature atrial complexes were rare less than 1%. Premature Ventricular complexes rare less than 1%.  No ventricular tachycardia, no supraventricular tachycardia, no pauses, No AV block and no atrial fibrillation present.  5 patient triggered  events: 2 associated with ectopic atrial rhythm the remaining associated with sinus rhythm. 1 diary event associated with ectopic atrial rhythm  Conclusion: This study is remarkable for symptomatic ectopic atrial rhythm.   Recent Labs: 02/03/2019: BUN 11; Creatinine, Ser 1.00; Hemoglobin 13.5; Magnesium 2.3; Platelets 286; Potassium 4.5; Sodium 141; TSH 0.581  Recent Lipid Panel No results found for: CHOL, TRIG, HDL, CHOLHDL, VLDL, LDLCALC, LDLDIRECT  Physical Exam:    VS:  BP 128/70   Pulse 64   Ht 5\' 8"  (1.727 m)   Wt 246 lb (111.6 kg)   BMI 37.40 kg/m     Wt Readings from Last 3 Encounters:  03/03/19 246 lb (111.6 kg)  02/03/19 244 lb (110.7 kg)  10/17/17 257 lb (116.6 kg)     GEN: Well nourished, well developed in no acute distress HEENT: Normal NECK: No JVD; No carotid bruits LYMPHATICS: No lymphadenopathy CARDIAC: S1S2 noted,RRR, no murmurs, rubs, gallops RESPIRATORY:  Clear to auscultation without rales, wheezing or rhonchi  ABDOMEN: Soft, non-tender, non-distended, +bowel sounds, no guarding. EXTREMITIES: No  edema, No cyanosis, no clubbing MUSCULOSKELETAL:  No deformity  SKIN: Warm and dry NEUROLOGIC:  Alert and oriented x 3, non-focal PSYCHIATRIC:  Normal affect, good insight  ASSESSMENT:    1. Ectopic atrial rhythm   2. Essential hypertension   3. Mixed hyperlipidemia   4. Tobacco use    PLAN:    Her monitor did show evidence of symptomatic ectopic atrial rhythm, she does tell me she is still experiencing fluttering and palpitations.  At this time I will start the patient on low-dose beta-blocker with Toprol-XL 12.5 mg daily.  She is agreeable to try this medication to see if she is going to have symptomatic relief.  Patient was educated that this medication all her questions were answered.  Her blood pressure is acceptable in the office today.   The patient was counseled on tobacco cessation today for 5 minutes.  Counseling included reviewing the risks  of smoking tobacco products, how it impacts the patient's current medical diagnoses and different strategies for quitting.  Pharmacotherapy to aid in tobacco cessation was not prescribed today. The patient coordinate with  primary care provider.  The patient was also advised to call   1-800-QUIT-NOW 581-436-5671) for additional help with quitting smoking.  The patient is in agreement with the above plan. The patient left the office in stable condition.  The patient will follow up in 1 month or sooner if needed.   Medication Adjustments/Labs and Tests Ordered: Current medicines are reviewed at length with the patient today.  Concerns regarding medicines are outlined above.  No orders of the defined types were placed in this encounter.  Meds ordered this encounter  Medications  . metoprolol succinate (TOPROL XL) 25 MG 24 hr tablet    Sig: Take 0.5 tablets (12.5 mg total) by mouth daily.    Dispense:  45 tablet    Refill:  1    Patient Instructions  Medication Instructions:  Your physician has recommended you make the following change in your medication:   START: Toprol XL (metorpolol succinate) 25 mg Take 1 /2 tab daily  *If you need a refill on your cardiac medications before your next appointment, please call your pharmacy*  Lab Work: None If you have labs (blood work) drawn today and your tests are completely normal, you will receive your results only by: Marland Kitchen MyChart Message (if you have MyChart) OR . A paper copy in the mail If you have any lab test that is abnormal or we need to change your treatment, we will call you to review the results.  Testing/Procedures: None  Follow-Up: At [facility], you and your health needs are our priority.  As part of our continuing mission to provide you with exceptional heart care, we have created designated Provider Care Teams.  These Care Teams include your primary Cardiologist (physician) and Advanced Practice Providers (APPs -   Physician Assistants and Nurse Practitioners) who all work together to provide you with the care you need, when you need it.  Your next appointment:   1 month(s)  The format for your next appointment:   In Person  Provider:   Berniece Salines, DO  Other Instructions      Adopting a Healthy Lifestyle.  Know what a healthy weight is for you (roughly BMI <25) and aim to maintain this   Aim for 7+ servings of fruits and vegetables daily   65-80+ fluid ounces of water or unsweet tea for healthy kidneys   Limit to max 1 drink  of alcohol per day; avoid smoking/tobacco   Limit animal fats in diet for cholesterol and heart health - choose grass fed whenever available   Avoid highly processed foods, and foods high in saturated/trans fats   Aim for low stress - take time to unwind and care for your mental health   Aim for 150 min of moderate intensity exercise weekly for heart health, and weights twice weekly for bone health   Aim for 7-9 hours of sleep daily   When it comes to diets, agreement about the perfect plan isnt easy to find, even among the experts. Experts at the [employer] developed an idea known as the Healthy Eating Plate. Just imagine a plate divided into logical, healthy portions.   The emphasis is on diet quality:   Load up on vegetables and fruits - one-half of your plate: Aim for color and variety, and remember that potatoes dont count.   Go for whole grains - one-quarter of your plate: Whole wheat, barley, wheat berries, quinoa, oats, brown rice, and foods made with them. If you want pasta, go with whole wheat pasta.   Protein power - one-quarter of your plate: Fish, chicken, beans, and nuts are all healthy, versatile protein sources. Limit red meat.   The diet, however, does go beyond the plate, offering a few other suggestions.   Use healthy plant oils, such as olive, canola, soy, corn, sunflower and peanut. Check the labels, and avoid  partially hydrogenated oil, which have unhealthy trans fats.   If youre thirsty, drink water. Coffee and tea are good in moderation, but skip sugary drinks and limit milk and dairy products to one or two daily servings.   The type of carbohydrate in the diet is more important than the amount. Some sources of carbohydrates, such as vegetables, fruits, whole grains, and beans-are healthier than others.   Finally, stay active  Signed, Berniece Salines, DO  03/03/2019 4:06 PM    [facility]

## 2019-03-03 NOTE — Patient Instructions (Signed)
Medication Instructions:  Your physician has recommended you make the following change in your medication:   START: Toprol XL (metorpolol succinate) 25 mg Take 1 /2 tab daily  *If you need a refill on your cardiac medications before your next appointment, please call your pharmacy*  Lab Work: None If you have labs (blood work) drawn today and your tests are completely normal, you will receive your results only by: Marland Kitchen MyChart Message (if you have MyChart) OR . A paper copy in the mail If you have any lab test that is abnormal or we need to change your treatment, we will call you to review the results.  Testing/Procedures: None  Follow-Up: At [facility], you and your health needs are our priority.  As part of our continuing mission to provide you with exceptional heart care, we have created designated Provider Care Teams.  These Care Teams include your primary Cardiologist (physician) and Advanced Practice Providers (APPs -  Physician Assistants and Nurse Practitioners) who all work together to provide you with the care you need, when you need it.  Your next appointment:   1 month(s)  The format for your next appointment:   In Person  Provider:   Berniece Salines, DO  Other Instructions

## 2019-03-07 ENCOUNTER — Ambulatory Visit: Payer: PRIVATE HEALTH INSURANCE | Admitting: Cardiology

## 2019-03-26 ENCOUNTER — Encounter: Payer: Self-pay | Admitting: Gastroenterology

## 2019-03-28 ENCOUNTER — Telehealth: Payer: Self-pay | Admitting: Cardiology

## 2019-03-28 NOTE — Telephone Encounter (Signed)
That sounds like a good plan.  Please have her continue it at nighttime she can call back if there is any problems

## 2019-03-28 NOTE — Telephone Encounter (Signed)
Returned call to patient who states Toprol was causing her to feel SOB, dizzy, and fatigued. She stopped taking the medication 2 days ago. Reports symptoms persist but she is aware medication may not be out of her system. She was prescribed Toprol for an ectopic atrial rhythm and she reports Toprol did help but made her feel strange. She feels the rhythm abnormality most often when she lies down to go to sleep. I advised that she may take the Toprol 12.5 mg prior to going to bed since it causes fatigue. She does not monitor HR or BP at home. I encouraged her to get a home BP/HR monitor and advised that I will route message to Dr. Harriet Masson for advice. I advised we will call back when Dr. Harriet Masson responds and patient thanked me for the call.

## 2019-03-28 NOTE — Telephone Encounter (Signed)
I returned call to patient who states she has been taking Toprol at night since she got the Rx because she takes levothyroxine in the morning. I apologized that I thought the idea of taking Toprol at bedtime seemed like a change when we talked earlier.  I advised patient that I will resend message to Dr. Harriet Masson for a possible alternative medication. She thanked me for the call.

## 2019-03-28 NOTE — Telephone Encounter (Signed)
Have her hold that medication until her next visit.

## 2019-03-28 NOTE — Telephone Encounter (Signed)
   Pt c/o medication issue:  1. Name of Medication:   metoprolol succinate (TOPROL XL) 25 MG 24 hr tablet    2. How are you currently taking this medication (dosage and times per day)? Take 0.5 tablets (12.5 mg total) by mouth daily.  3. Are you having a reaction (difficulty breathing--STAT)? SOB, dizziness and fatigue  4. What is your medication issue? Pt said since she start taken this medication, she started having shortness of breath, dizziness and fatigue, she would like to know if she can have med.  Please call

## 2019-03-31 NOTE — Telephone Encounter (Signed)
Return phone call from pt.  Advised pt per Dr Harriet Masson to hold her Toprol until her next appointment.  Pt verbalizes understanding and agrees with current plan.

## 2019-03-31 NOTE — Telephone Encounter (Signed)
Left message for pt to contact triage RN at [phone]. 

## 2019-04-08 ENCOUNTER — Other Ambulatory Visit: Payer: Self-pay

## 2019-04-08 ENCOUNTER — Encounter: Payer: Self-pay | Admitting: Gastroenterology

## 2019-04-08 ENCOUNTER — Ambulatory Visit (INDEPENDENT_AMBULATORY_CARE_PROVIDER_SITE_OTHER): Payer: PRIVATE HEALTH INSURANCE | Admitting: Gastroenterology

## 2019-04-08 VITALS — BP 120/80 | HR 72 | Temp 98.4°F | Ht 68.0 in | Wt 244.4 lb

## 2019-04-08 DIAGNOSIS — R1310 Dysphagia, unspecified: Secondary | ICD-10-CM

## 2019-04-08 DIAGNOSIS — R1032 Left lower quadrant pain: Secondary | ICD-10-CM

## 2019-04-08 DIAGNOSIS — R1319 Other dysphagia: Secondary | ICD-10-CM

## 2019-04-08 DIAGNOSIS — K625 Hemorrhage of anus and rectum: Secondary | ICD-10-CM | POA: Diagnosis not present

## 2019-04-08 MED ORDER — PANTOPRAZOLE SODIUM 40 MG PO TBEC: 40.0000 mg | DELAYED_RELEASE_TABLET | Freq: Two times a day (BID) | ORAL | 3 refills | Status: AC

## 2019-04-08 NOTE — Patient Instructions (Addendum)
If you are age 51 or older, your body mass index should be between 23-30. Your Body mass index is 37.16 kg/m. If this is out of the aforementioned range listed, please consider follow up with your Primary Care Provider.  If you are age 89 or younger, your body mass index should be between 19-25. Your Body mass index is 37.16 kg/m. If this is out of the aformentioned range listed, please consider follow up with your Primary Care Provider.   [facility] [place] [address] [address] [facility]   You are scheduled on 04/18/19 at 1:30pm. You should arrive 15 minutes prior to your appointment time for registration. Please follow the written instructions below on the day of your exam:  WARNING: IF YOU ARE ALLERGIC TO IODINE/X-RAY DYE, PLEASE NOTIFY RADIOLOGY IMMEDIATELY AT [phone]! YOU WILL BE GIVEN A 13 HOUR PREMEDICATION PREP.  1) Do not eat or drink anything after 9:30am (4 hours prior to your test) 2) You have been given 2 bottles of oral contrast to drink. The solution may taste better if refrigerated, but do NOT add ice or any other liquid to this solution. Shake well before drinking.    Drink 1 bottle of contrast @ 11:30am (2 hours prior to your exam)  Drink 1 bottle of contrast @ 12:30pm (1 hour prior to your exam)  You may take any medications as prescribed with a small amount of water, if necessary. If you take any of the following medications: METFORMIN, GLUCOPHAGE, GLUCOVANCE, AVANDAMET, RIOMET, FORTAMET, [place] MET, JANUMET, GLUMETZA or METAGLIP, you MAY be asked to HOLD this medication 48 hours AFTER the exam.  The purpose of you drinking the oral contrast is to aid in the visualization of your intestinal tract. The contrast solution may cause some diarrhea. Depending on your individual set of symptoms, you may also receive an intravenous injection of x-ray contrast/dye. Plan on  being at [facility] for 30 minutes or longer, depending on the type of exam you are having performed.  This test typically takes 30-45 minutes to complete.  If you have any questions regarding your exam or if you need to reschedule, you may call the CT department at [phone] between the hours of 8:00 am and 5:00 pm, Monday-Friday.  ________________________________________________________________________    We have sent the following medications to your pharmacy for you to pick up at your convenience: Protonix 40 mg twice daily.   Please purchase the following medications over the counter and take as directed: Miralax 17 grams once daily   Please have your labs drawn at [facility] tomorrow.   It has been recommended to you by your physician that you have a(n) EGD/Colonoscopy completed. Per your request, we did not schedule the procedure(s) today. Please contact our office at [phone] should you decide to have the procedure completed. You will be scheduled for a pre-visit and procedure at that time.     Thank you,  Dr. Jackquline Denmark

## 2019-04-08 NOTE — Progress Notes (Signed)
Chief Complaint:   Referring Provider:  Physicians, [place];   #1. Rectal bleeding. Nl CBC  #2. IBS-C. Failed Amitiza, Trulance and Linzess.  #3. LLQ pain with tenderness  #4. GERD with small HH with dysphagia  Plan: - CTAP with PO and IV contrast - Proceed with EGD with dil/colon with 2 day prep. Discussed risks & benefits. (Risks including rare perforation req laparotomy, bleeding after bx/polypectomy req blood transfusion, rarely missing neoplasms, risks of anesthesia/sedation). Benefits outweigh the risks. Patient agrees to proceed. All the questions were answered. Consent forms given for review. -Protonix 40mg  po bid -Mirlax 17g po qd.    HPI:    Debra Wolf is a 49 y.o. female  Long standing constipation x 20 years, getting worse.  She is having BMs 2/day, hard stool, incomplete evacuation, associated left lower quadrant abdominal pain, generalized abdominal pain and bloating. LLQ pain radiates to the back.  She did have subjective chills but no fever.  Has had rectal bleeding 2-3 weeks continuously, but better now.  CBC and BMP were normal except mildly elevated WBC.  Also has been having problems with food getting hung up in the lower chest.  Mostly solids.  More prominently over the last 1 year.  It occurs intermittently.  She does have heartburn which is better on Protonix.  Occasionally, she has to use it twice a day.  Has longstanding history of nausea but no vomiting.  No unintentional weight loss.   No sodas, chocolates, chewing gums, artificial sweeteners and candy. No NSAIDs  s/p Laparoscopic BSO on 03/06/2017 for a pelvic mass. 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 Melina Copa 2016- neg. No report -EGD 10/25/2015: Small hiatal hernia, mild gastritis.  Negative small bowel biopsies for celiac.  Negative for H. Pylori. -Upper GI with small bowel series 12/2014: neg -CT  12.2018: Complex left adnexal mass, fatty liver, S/P cholecystectomy Past Medical History:  Diagnosis Date  . Anxiety   . Benign essential hypertension   . COPD (chronic obstructive pulmonary disease) ([place])   . Dyslipidemia   . GERD (gastroesophageal reflux disease)   . Hypothyroid   . Mass of ovary     Past Surgical History:  Procedure Laterality Date  . CHOLECYSTECTOMY    . COLONOSCOPY  2016   Dr Orlena Sheldon  . ESOPHAGOGASTRODUODENOSCOPY  10/25/2015   Mild gastritis. Minimal hiatal hernia. Otherwise, normal EGD  . PARTIAL HYSTERECTOMY    . removal of deep brain stimulator  05/16/2016   [facility]. [facility]ylliss [facility] at [facility]  . TUBAL LIGATION      Family History  Problem Relation Age of Onset  . Bladder Cancer Mother   . Congestive Heart Failure Mother   . Lung cancer Father   . Congestive Heart Failure Father   . Ovarian cancer Sister   . Cirrhosis Maternal Uncle        alcoholic  . Colon cancer Neg Hx   . Liver cancer Neg Hx     Social History   Tobacco Use  . Smoking status: Current Every Day Smoker    Packs/day: 1.00  . Smokeless tobacco: Never Used  Substance Use Topics  . Alcohol use: Not on file  . Drug use: No    Current Outpatient Medications  Medication Sig Dispense Refill  . albuterol (PROVENTIL HFA;VENTOLIN HFA) 108 (90 Base) MCG/ACT inhaler Inhale into the lungs in the morning and at  bedtime.     . budesonide-formoterol (SYMBICORT) 160-4.5 MCG/ACT inhaler INHALE TWO PUFFS TWICE DAILY    . cyclobenzaprine (FLEXERIL) 10 MG tablet Take 10 mg by mouth.    . fluticasone (FLONASE) 50 MCG/ACT nasal spray Use 2 sprays in each nostril once daily    . furosemide (LASIX) 20 MG tablet Take 1 tablet (20 mg total) by mouth daily. 90 tablet 1  . levothyroxine (SYNTHROID, LEVOTHROID) 150 MCG tablet Take 150 mcg by mouth daily.  2  . nitroGLYCERIN (NITROSTAT) 0.4 MG SL tablet DISSOLVE 1 TABLET UNDER THE TONGUE EVERY 5 MINUTES AS NEEDED FOR CHEST PAIN. DO NOT EXCEED A TOTAL OF 3  DOSES IN 15 MINUTES. 30 tablet 3  . pantoprazole (PROTONIX) 40 MG tablet Take by mouth daily.     . potassium chloride SA (KLOR-CON) 20 MEQ tablet Take 1 tablet (20 mEq total) by mouth daily. 90 tablet 1  . Vitamin D, Ergocalciferol, (DRISDOL) 50000 units CAPS capsule TAKE ONE CAPSULE BY MOUTH ONCE A WEEK     No current facility-administered medications for this visit.    Allergies  Allergen Reactions  . Penicillins Hives and Swelling    Tolerated amoxicillin course in 2014 Tolerated amoxicillin course in 2014   . Vancomycin Anaphylaxis  . Cephalexin Itching    Review of Systems:  Constitutional: Denies fever, chills, diaphoresis, appetite change and fatigue.  HEENT: Denies photophobia, eye pain, redness, hearing loss, ear pain, congestion, sore throat, rhinorrhea, sneezing, mouth sores, neck pain, neck stiffness and tinnitus.   Respiratory: Denies SOB, DOE, cough, chest tightness,  and wheezing.   Cardiovascular: Denies chest pain, palpitations and leg swelling.  Genitourinary: Denies dysuria, urgency, frequency, hematuria, flank pain and difficulty urinating.  Musculoskeletal: Has myalgias, back pain, joint swelling, arthralgias and gait problem.  Skin: No rash.  Neurological: Denies dizziness, seizures, syncope, weakness, light-headedness, numbness and headaches.  Hematological: Denies adenopathy. Easy bruising, personal or family bleeding history  Psychiatric/Behavioral: No anxiety or depression     Physical Exam:    BP 120/80   Pulse 72   Temp 98.4 F (36.9 C)   Ht 5\' 8"  (1.727 m)   Wt 244 lb 6 oz (110.8 kg)   BMI 37.16 kg/m  Wt Readings from Last 3 Encounters:  04/08/19 244 lb 6 oz (110.8 kg)  03/03/19 246 lb (111.6 kg)  02/03/19 244 lb (110.7 kg)   Constitutional:  Well-developed, in no acute distress. Psychiatric: Normal mood and affect. Behavior is normal. HEENT: Pupils normal.  Conjunctivae are normal. No scleral icterus. Neck supple.  Cardiovascular:  Normal rate, regular rhythm. No edema Pulmonary/chest: Effort normal and breath sounds normal. No wheezing, rales or rhonchi. Abdominal: Soft, nondistended.  Left lower quadrant abdominal tenderness without rebound. Bowel sounds active throughout. There are no masses palpable. No hepatomegaly. Rectal:  defered Neurological: Alert and oriented to person place and time. Skin: Skin is warm and dry. No rashes noted.  Data Reviewed: I have personally reviewed following labs and imaging studies  CBC: CBC Latest Ref Rng & Units 02/03/2019  WBC 3.4 - 10.8 x10E3/uL 12.2(H)  Hemoglobin 11.1 - 15.9 g/dL 13.5  Hematocrit 34.0 - 46.6 % 40.6  Platelets 150 - 450 x10E3/uL 286    CMP: CMP Latest Ref Rng & Units 02/03/2019  Glucose 65 - 99 mg/dL 83  BUN 6 - 24 mg/dL 11  Creatinine 0.57 - 1.00 mg/dL 1.00  Sodium 134 - 144 mmol/L 141  Potassium 3.5 - 5.2 mmol/L 4.5  Chloride 96 -  106 mmol/L 99  CO2 20 - 29 mmol/L 26  Calcium 8.7 - 10.2 mg/dL 9.4      Carmell Austria, MD 04/08/2019, 2:47 PM  Cc: Physicians, [place]

## 2019-04-14 ENCOUNTER — Telehealth: Payer: Self-pay | Admitting: Gastroenterology

## 2019-04-14 NOTE — Telephone Encounter (Signed)
I advised the pt that we have not received labs to have those refaxed to Dr Evette Doffing attention.  The pt has been advised of the information and verbalized understanding.

## 2019-04-15 ENCOUNTER — Other Ambulatory Visit: Payer: Self-pay

## 2019-04-15 ENCOUNTER — Ambulatory Visit (INDEPENDENT_AMBULATORY_CARE_PROVIDER_SITE_OTHER): Payer: PRIVATE HEALTH INSURANCE

## 2019-04-15 DIAGNOSIS — R0600 Dyspnea, unspecified: Secondary | ICD-10-CM | POA: Diagnosis not present

## 2019-04-15 DIAGNOSIS — R002 Palpitations: Secondary | ICD-10-CM | POA: Diagnosis not present

## 2019-04-15 NOTE — Progress Notes (Signed)
Complete echocardiogram performed.  Jimmy Lorri Fukuhara RDCS, RVT  

## 2019-04-17 ENCOUNTER — Telehealth: Payer: Self-pay | Admitting: Gastroenterology

## 2019-04-17 NOTE — Telephone Encounter (Signed)
Patient aware that the labs are at CT and good.

## 2019-04-18 ENCOUNTER — Ambulatory Visit (HOSPITAL_BASED_OUTPATIENT_CLINIC_OR_DEPARTMENT_OTHER)
Admission: RE | Admit: 2019-04-18 | Discharge: 2019-04-18 | Disposition: A | Discharge status: Home / Self Care | Payer: Managed Care, Other (non HMO) | Source: Ambulatory Visit | Attending: Gastroenterology | Admitting: Gastroenterology

## 2019-04-18 ENCOUNTER — Other Ambulatory Visit: Payer: Self-pay

## 2019-04-18 DIAGNOSIS — R1032 Left lower quadrant pain: Secondary | ICD-10-CM | POA: Diagnosis present

## 2019-04-18 MED ORDER — IOHEXOL 300 MG/ML  SOLN
100.0000 mL | Freq: Once | INTRAMUSCULAR | Status: AC | PRN
  Administered 2019-04-18: 13:00:00 100 mL via INTRAVENOUS

## 2019-04-21 ENCOUNTER — Encounter: Payer: Self-pay | Admitting: Cardiology

## 2019-04-21 ENCOUNTER — Other Ambulatory Visit: Payer: Self-pay

## 2019-04-21 ENCOUNTER — Telehealth: Payer: Self-pay | Admitting: Gastroenterology

## 2019-04-21 ENCOUNTER — Ambulatory Visit (INDEPENDENT_AMBULATORY_CARE_PROVIDER_SITE_OTHER): Payer: PRIVATE HEALTH INSURANCE | Admitting: Cardiology

## 2019-04-21 VITALS — BP 120/78 | HR 76 | Ht 68.0 in | Wt 246.0 lb

## 2019-04-21 DIAGNOSIS — I491 Atrial premature depolarization: Secondary | ICD-10-CM | POA: Diagnosis not present

## 2019-04-21 DIAGNOSIS — E0801 Diabetes mellitus due to underlying condition with hyperosmolarity with coma: Secondary | ICD-10-CM

## 2019-04-21 DIAGNOSIS — E782 Mixed hyperlipidemia: Secondary | ICD-10-CM

## 2019-04-21 DIAGNOSIS — R002 Palpitations: Secondary | ICD-10-CM | POA: Diagnosis not present

## 2019-04-21 DIAGNOSIS — R0609 Other forms of dyspnea: Secondary | ICD-10-CM

## 2019-04-21 DIAGNOSIS — I10 Essential (primary) hypertension: Secondary | ICD-10-CM

## 2019-04-21 DIAGNOSIS — R0600 Dyspnea, unspecified: Secondary | ICD-10-CM

## 2019-04-21 MED ORDER — DILTIAZEM HCL ER COATED BEADS 120 MG PO TB24: 120.0000 mg | ORAL_TABLET | Freq: Every day | ORAL | 1 refills | Status: DC

## 2019-04-21 NOTE — Telephone Encounter (Signed)
Please review and advise.

## 2019-04-21 NOTE — Patient Instructions (Signed)
Medication Instructions:    START TAKING DILTIAZEM 120 MG ONCE A DAY   *If you need a refill on your cardiac medications before your next appointment, please call your pharmacy*   Lab Work:  NONE ORDERED  TODAY   If you have labs (blood work) drawn today and your tests are completely normal, you will receive your results only by: Marland Kitchen MyChart Message (if you have MyChart) OR . A paper copy in the mail If you have any lab test that is abnormal or we need to change your treatment, we will call you to review the results.   Testing/Procedures:  NONE ORDERED  TODAY    Follow-Up: At [facility], you and your health needs are our priority.  As part of our continuing mission to provide you with exceptional heart care, we have created designated Provider Care Teams.  These Care Teams include your primary Cardiologist (physician) and Advanced Practice Providers (APPs -  Physician Assistants and Nurse Practitioners) who all work together to provide you with the care you need, when you need it.  We recommend signing up for the patient portal called "MyChart".  Sign up information is provided on this After Visit Summary.  MyChart is used to connect with patients for Virtual Visits (Telemedicine).  Patients are able to view lab/test results, encounter notes, upcoming appointments, etc.  Non-urgent messages can be sent to your provider as well.   To learn more about what you can do with MyChart, go to [email or web address].    Your next appointment:   3 month(s)  The format for your next appointment:   In Person  Provider:   You will see Berniece Salines, DO.  Or, you can be scheduled with the following Advanced Practice Provider on your designated Care Team (at our [facility]):  Laurann Montana, FNP     Other Instructions

## 2019-04-21 NOTE — Telephone Encounter (Signed)
Patient called about lab and imaging results, please call patient.

## 2019-04-21 NOTE — Progress Notes (Signed)
Cardiology Office Note:    Date:  04/21/2019   ID:  Debra Wolf, DOB 1970/11/07, MRN [id number]  PCP:  [facility]  Cardiologist:  Berniece Salines, DO  Electrophysiologist:  None   Referring MD: Physicians, [place]   Chief Complaint  Patient presents with  . Follow-up    History of Present Illness:    Debra Wolf is a 49 y.o. female with a hx of hypertension, hyperlipidemia, ectopic atrial rhythm presents today for follow-up visit.  Patient tells me that she still experiencing palpitations.  She denies any chest pain, shortness of breath, nausea, vomiting.  In addition she notes that she has been experiencing symptoms of pain that she is being worked up by GI.  No other complaints at this time.  Last saw the patient on March 08, 2019 at that time started on low-dose beta-blocker but she experienced significant headache from his medication therefore medication was stopped.  Past Medical History:  Diagnosis Date  . Anxiety   . Benign essential hypertension   . COPD (chronic obstructive pulmonary disease) ([place])   . Dyslipidemia   . GERD (gastroesophageal reflux disease)   . Hypothyroid   . Mass of ovary     Past Surgical History:  Procedure Laterality Date  . CHOLECYSTECTOMY    . COLONOSCOPY  2016   Dr Orlena Sheldon  . ESOPHAGOGASTRODUODENOSCOPY  10/25/2015   Mild gastritis. Minimal hiatal hernia. Otherwise, normal EGD  . PARTIAL HYSTERECTOMY    . removal of deep brain stimulator  05/16/2016   Dr. [facility]ylliss Blakes at [facility]  . TUBAL LIGATION      Current Medications: Current Meds  Medication Sig  . albuterol (PROVENTIL HFA;VENTOLIN HFA) 108 (90 Base) MCG/ACT inhaler Inhale into the lungs in the morning and at bedtime.   . budesonide-formoterol (SYMBICORT) 160-4.5 MCG/ACT inhaler INHALE TWO PUFFS TWICE DAILY  . cyclobenzaprine (FLEXERIL) 10 MG tablet Take 10 mg by mouth.  . fluticasone (FLONASE) 50 MCG/ACT nasal spray Use 2 sprays in each nostril once daily  .  furosemide (LASIX) 20 MG tablet Take 1 tablet (20 mg total) by mouth daily.  Marland Kitchen levothyroxine (SYNTHROID, LEVOTHROID) 150 MCG tablet Take 150 mcg by mouth daily.  . nitroGLYCERIN (NITROSTAT) 0.4 MG SL tablet DISSOLVE 1 TABLET UNDER THE TONGUE EVERY 5 MINUTES AS NEEDED FOR CHEST PAIN. DO NOT EXCEED A TOTAL OF 3 DOSES IN 15 MINUTES.  . pantoprazole (PROTONIX) 40 MG tablet Take 1 tablet (40 mg total) by mouth 2 (two) times daily.  . potassium chloride SA (KLOR-CON) 20 MEQ tablet Take 1 tablet (20 mEq total) by mouth daily.  . Vitamin D, Ergocalciferol, (DRISDOL) 50000 units CAPS capsule TAKE ONE CAPSULE BY MOUTH ONCE A WEEK     Allergies:   Penicillins, Vancomycin, and Cephalexin   Social History   Socioeconomic History  . Marital status: Married    Spouse name: Not on file  . Number of children: 2  . Years of education: Not on file  . Highest education level: Not on file  Occupational History  . Occupation: disabled  Tobacco Use  . Smoking status: Current Every Day Smoker    Packs/day: 1.00  . Smokeless tobacco: Never Used  Substance and Sexual Activity  . Alcohol use: Not on file  . Drug use: No  . Sexual activity: Not on file  Other Topics Concern  . Not on file  Social History Narrative  . Not on file   Social Determinants of Health  Financial Resource Strain:   . Difficulty of Paying Living Expenses:   Food Insecurity:   . Worried About Charity fundraiser in the Last Year:   . Arboriculturist in the Last Year:   Transportation Needs:   . Film/video editor (Medical):   Marland Kitchen Lack of Transportation (Non-Medical):   Physical Activity:   . Days of Exercise per Week:   . Minutes of Exercise per Session:   Stress:   . Feeling of Stress :   Social Connections:   . Frequency of Communication with Friends and Family:   . Frequency of Social Gatherings with Friends and Family:   . Attends Religious Services:   . Active Member of Clubs or Organizations:   . Attends English as a second language teacher Meetings:   Marland Kitchen Marital Status:      Family History: The patient's family history includes Bladder Cancer in her mother; Cirrhosis in her maternal uncle; Congestive Heart Failure in her father and mother; Lung cancer in her father; Ovarian cancer in her sister. There is no history of Colon cancer or Liver cancer.  ROS:   Review of Systems  Constitution: Negative for decreased appetite, fever and weight gain.  HENT: Negative for congestion, ear discharge, hoarse voice and sore throat.   Eyes: Negative for discharge, redness, vision loss in right eye and visual halos.  Cardiovascular: Negative for chest pain, dyspnea on exertion, leg swelling, orthopnea and palpitations.  Respiratory: Negative for cough, hemoptysis, shortness of breath and snoring.   Endocrine: Negative for heat intolerance and polyphagia.  Hematologic/Lymphatic: Negative for bleeding problem. Does not bruise/bleed easily.  Skin: Negative for flushing, nail changes, rash and suspicious lesions.  Musculoskeletal: Negative for arthritis, joint pain, muscle cramps, myalgias, neck pain and stiffness.  Gastrointestinal: Negative for abdominal pain, bowel incontinence, diarrhea and excessive appetite.  Genitourinary: Negative for decreased libido, genital sores and incomplete emptying.  Neurological: Negative for brief paralysis, focal weakness, headaches and loss of balance.  Psychiatric/Behavioral: Negative for altered mental status, depression and suicidal ideas.  Allergic/Immunologic: Negative for HIV exposure and persistent infections.    EKGs/Labs/Other Studies Reviewed:    The following studies were reviewed today:    EKG: None today  ZIO monitor: The patient wore the monitor for 6 days 17 hours starting February 03, 2019. Indication: Palpitations The minimum heart rate was 43 bpm, maximum heart rate was 121 bpm, and average heart rate was 71 bpm. Predominant underlying rhythm was Sinus Rhythm with  some episodes of ectopic atrial rhythm. Premature atrial complexes were rare less than 1%. Premature Ventricular complexes rare less than 1%. No ventricular tachycardia, no supraventricular tachycardia, no pauses, No AV block and no atrial fibrillation present. 5 patient triggered events: 2 associated with ectopic atrial rhythm the remaining associated with sinus rhythm. 1 diary event associated with ectopic atrial rhythm Conclusion: This study is remarkable for symptomatic ectopic atrial rhythm.  TTE IMPRESSIONS 04/15/2019 1. Left ventricular ejection fraction, by estimation, is 60 to 65%. The  left ventricle has normal function. The left ventricle has no regional  wall motion abnormalities. Left ventricular diastolic parameters were  normal.  2. Right ventricular systolic function is normal. The right ventricular  size is normal.  3. Left atrial size was mildly dilated.  4. The mitral valve is normal in structure. No evidence of mitral valve  regurgitation. No evidence of mitral stenosis.  5. The aortic valve is normal in structure. Aortic valve regurgitation is  not visualized. No aortic  stenosis is present.  6. The inferior vena cava is normal in size with greater than 50%  respiratory variability, suggesting right atrial pressure of 3 mmHg.   Recent Labs: 02/03/2019: BUN 11; Creatinine, Ser 1.00; Hemoglobin 13.5; Magnesium 2.3; Platelets 286; Potassium 4.5; Sodium 141; TSH 0.581  Recent Lipid Panel No results found for: CHOL, TRIG, HDL, CHOLHDL, VLDL, LDLCALC, LDLDIRECT  Physical Exam:    VS:  BP 120/78 (BP Location: Right Arm, Patient Position: Sitting, Cuff Size: Large)   Pulse 76   Ht 5\' 8"  (1.727 m)   Wt 246 lb (111.6 kg)   LMP  (LMP Unknown) Comment: partial hysterectomy  SpO2 98%   BMI 37.40 kg/m     Wt Readings from Last 3 Encounters:  04/21/19 246 lb (111.6 kg)  04/08/19 244 lb 6 oz (110.8 kg)  03/03/19 246 lb (111.6 kg)     GEN: Well nourished, well  developed in no acute distress HEENT: Normal NECK: No JVD; No carotid bruits LYMPHATICS: No lymphadenopathy CARDIAC: S1S2 noted,RRR, no murmurs, rubs, gallops RESPIRATORY:  Clear to auscultation without rales, wheezing or rhonchi  ABDOMEN: Soft, non-tender, non-distended, +bowel sounds, no guarding. EXTREMITIES: No edema, No cyanosis, no clubbing MUSCULOSKELETAL:  No deformity  SKIN: Warm and dry NEUROLOGIC:  Alert and oriented x 3, non-focal PSYCHIATRIC:  Normal affect, good insight  ASSESSMENT:    1. Ectopic atrial rhythm   2. Diabetes mellitus due to underlying condition with hyperosmolarity and coma, without long-term current use of insulin ([place])   3. Morbid obesity (HCC)   4. Palpitation   5. DOE (dyspnea on exertion)   6. Mixed hyperlipidemia   7. Essential hypertension    PLAN:     She still is experiencing significant palpitations.  Not tolerate the beta-blocker she reported significant headaches from the medication therefore it was stopped.  She preferred to try another medication I will therefore start the patient on diltiazem 120 mg daily.  Her blood pressure will continue to monitor her blood pressure especially in the setting of the addition of diltiazem 120 mg daily.  Hyperlipidemia-she currently is not most recent LDL 120, HDL 37, triglyceride 140.  We will continue to encourage the patient for starting statins.  Obesity-the patient understands the need to lose weight with diet and exercise. We have discussed specific strategies for this.  Shortness of breath is likely multifactorial which includes deconditioning echocardiogram does not show any etiology-no structural abnormalities and right heart pressures appears to be normal.  The patient is in agreement with the above plan. The patient left the office in stable condition.  The patient will follow up in 3 months.    Medication Adjustments/Labs and Tests Ordered: Current medicines are reviewed at length with  the patient today.  Concerns regarding medicines are outlined above.  No orders of the defined types were placed in this encounter.  Meds ordered this encounter  Medications  . diltiazem (CARDIZEM LA) 120 MG 24 hr tablet    Sig: Take 1 tablet (120 mg total) by mouth daily.    Dispense:  90 tablet    Refill:  1    Patient Instructions  Medication Instructions:    START TAKING DILTIAZEM 120 MG ONCE A DAY   *If you need a refill on your cardiac medications before your next appointment, please call your pharmacy*   Lab Work:  NONE ORDERED  TODAY   If you have labs (blood work) drawn today and your tests are completely normal, you will  receive your results only by: Marland Kitchen MyChart Message (if you have MyChart) OR . A paper copy in the mail If you have any lab test that is abnormal or we need to change your treatment, we will call you to review the results.   Testing/Procedures:  NONE ORDERED  TODAY    Follow-Up: At [facility], you and your health needs are our priority.  As part of our continuing mission to provide you with exceptional heart care, we have created designated Provider Care Teams.  These Care Teams include your primary Cardiologist (physician) and Advanced Practice Providers (APPs -  Physician Assistants and Nurse Practitioners) who all work together to provide you with the care you need, when you need it.  We recommend signing up for the patient portal called "MyChart".  Sign up information is provided on this After Visit Summary.  MyChart is used to connect with patients for Virtual Visits (Telemedicine).  Patients are able to view lab/test results, encounter notes, upcoming appointments, etc.  Non-urgent messages can be sent to your provider as well.   To learn more about what you can do with MyChart, go to [email or web address].    Your next appointment:   3 month(s)  The format for your next appointment:   In Person  Provider:   You will see Berniece Salines,  DO.  Or, you can be scheduled with the following Advanced Practice Provider on your designated Care Team (at our [facility]):  Laurann Montana, FNP     Other Instructions      Adopting a Healthy Lifestyle.  Know what a healthy weight is for you (roughly BMI <25) and aim to maintain this   Aim for 7+ servings of fruits and vegetables daily   65-80+ fluid ounces of water or unsweet tea for healthy kidneys   Limit to max 1 drink of alcohol per day; avoid smoking/tobacco   Limit animal fats in diet for cholesterol and heart health - choose grass fed whenever available   Avoid highly processed foods, and foods high in saturated/trans fats   Aim for low stress - take time to unwind and care for your mental health   Aim for 150 min of moderate intensity exercise weekly for heart health, and weights twice weekly for bone health   Aim for 7-9 hours of sleep daily   When it comes to diets, agreement about the perfect plan isnt easy to find, even among the experts. Experts at the [employer] developed an idea known as the Healthy Eating Plate. Just imagine a plate divided into logical, healthy portions.   The emphasis is on diet quality:   Load up on vegetables and fruits - one-half of your plate: Aim for color and variety, and remember that potatoes dont count.   Go for whole grains - one-quarter of your plate: Whole wheat, barley, wheat berries, quinoa, oats, brown rice, and foods made with them. If you want pasta, go with whole wheat pasta.   Protein power - one-quarter of your plate: Fish, chicken, beans, and nuts are all healthy, versatile protein sources. Limit red meat.   The diet, however, does go beyond the plate, offering a few other suggestions.   Use healthy plant oils, such as olive, canola, soy, corn, sunflower and peanut. Check the labels, and avoid partially hydrogenated oil, which have unhealthy trans fats.   If youre thirsty, drink  water. Coffee and tea are good in moderation, but skip sugary drinks and  limit milk and dairy products to one or two daily servings.   The type of carbohydrate in the diet is more important than the amount. Some sources of carbohydrates, such as vegetables, fruits, whole grains, and beans-are healthier than others.   Finally, stay active  Signed, Berniece Salines, DO  04/21/2019 5:41 PM    [facility]

## 2019-04-28 DIAGNOSIS — R1310 Dysphagia, unspecified: Secondary | ICD-10-CM

## 2019-04-28 DIAGNOSIS — R1319 Other dysphagia: Secondary | ICD-10-CM

## 2019-04-28 DIAGNOSIS — K625 Hemorrhage of anus and rectum: Secondary | ICD-10-CM

## 2019-04-28 DIAGNOSIS — R1032 Left lower quadrant pain: Secondary | ICD-10-CM

## 2019-04-28 DIAGNOSIS — Z01818 Encounter for other preprocedural examination: Secondary | ICD-10-CM

## 2019-04-28 MED ORDER — CLENPIQ 10-3.5-12 MG-GM -GM/160ML PO SOLN: 1.0000 | Freq: Once | ORAL | 0 refills | Status: AC

## 2019-04-29 ENCOUNTER — Telehealth: Payer: Self-pay

## 2019-04-29 NOTE — Telephone Encounter (Signed)
Called patient and left voicemail to call back regarding clenpiq. Wants to see if she can pick a sample up since insurance isn't covering prep

## 2019-05-01 MED ORDER — NA SULFATE-K SULFATE-MG SULF 17.5-3.13-1.6 GM/177ML PO SOLN: 1.0000 | Freq: Once | ORAL | 0 refills | Status: AC

## 2019-05-01 NOTE — Telephone Encounter (Signed)
Patient called back and she will do the 2 day miralax prep due to insurance cost. Suprep and clenpiq isn't covered and what is covered by insurance is not at the pharmacy. Patient understands and will look at instructions on mychart and will have them mailed. When lab work comes I patient will receive a copy from [facility]. Patient was told to call back with any questions. Patient know that 119 grams is 7 capfuls and 238gram is 14 capfuls

## 2019-05-12 ENCOUNTER — Encounter: Payer: Self-pay | Admitting: Gastroenterology

## 2019-05-26 ENCOUNTER — Encounter: Payer: PRIVATE HEALTH INSURANCE | Admitting: Gastroenterology

## 2019-06-10 ENCOUNTER — Telehealth: Payer: Self-pay | Admitting: *Deleted

## 2019-06-10 NOTE — Telephone Encounter (Signed)
Dr. Lyndel Safe and Jenny Reichmann,  Could you please review this pt's chart?  She has been followed by cardiology for an ectopic atrial rhythm.  She is not due for a follow for 3 months, but just wanted you to be aware of this.  Also, under her surgical procedures, she did have issues with anesthesia with her procedure in 2018.  Would you please review that? Is she ok for [facility]? Thanks, [employer]

## 2019-06-11 NOTE — Telephone Encounter (Signed)
Has ectopic atrial rhythm, could not tolerate beta-blockers due to headaches, is currently on Cardizem. I think she should be okay for [facility] lets run it by Osvaldo Angst, just to be sure   Jenny Reichmann, can you please review her chart and determine if she is okay for [facility]

## 2019-06-12 NOTE — Telephone Encounter (Signed)
noted 

## 2019-06-12 NOTE — Telephone Encounter (Signed)
Dr. Lyndel Safe and Cyril Mourning,  This pt is cleared for anesthetic care at [facility].  Thanks,  Osvaldo Angst

## 2019-06-17 ENCOUNTER — Other Ambulatory Visit: Payer: Self-pay

## 2019-06-17 ENCOUNTER — Ambulatory Visit (AMBULATORY_SURGERY_CENTER): Payer: Self-pay

## 2019-06-17 VITALS — Ht 68.0 in | Wt 248.0 lb

## 2019-06-17 DIAGNOSIS — R1310 Dysphagia, unspecified: Secondary | ICD-10-CM

## 2019-06-17 DIAGNOSIS — R1032 Left lower quadrant pain: Secondary | ICD-10-CM

## 2019-06-17 DIAGNOSIS — K625 Hemorrhage of anus and rectum: Secondary | ICD-10-CM

## 2019-06-17 DIAGNOSIS — R1319 Other dysphagia: Secondary | ICD-10-CM

## 2019-06-17 NOTE — Progress Notes (Signed)
Patient's pre-visit was done today over the phone with the patient due to COVID-19 pandemic.   Name,DOB and address verified. Insurance verified. Packet of Prep instructions mailed to patient including copy of a consent form and pre-procedure patient acknowledgement form-pt is aware.   Patient is to come to the [place] location-3rd floor to pick up Plenvu sample. Lot [id number] Exp 01/2020   Patient understands to call us back with any questions or concerns. COVID-19 screening test is on 06/27/19 @ 3:00, the patient is aware.  Pt is aware that care partner will wait in the car during procedure; if they feel like they will be too hot or cold to wait in the car; they may wait in the 4 th floor lobby. Patient is aware to bring only one care partner. We want them to wear a mask (we do not have any that we can provide them), practice social distancing, and we will check their temperatures when they get here.  I did remind the patient that their care partner needs to stay in the parking lot the entire time and have a cell phone available, we will call them when the pt is ready for discharge. Patient will wear mask into building.  Self report for wt 248 lb w/BMI calculated from this.

## 2019-06-24 ENCOUNTER — Encounter: Payer: Self-pay | Admitting: Gastroenterology

## 2019-06-27 ENCOUNTER — Other Ambulatory Visit: Payer: Self-pay | Admitting: Gastroenterology

## 2019-06-27 ENCOUNTER — Ambulatory Visit (INDEPENDENT_AMBULATORY_CARE_PROVIDER_SITE_OTHER): Payer: PRIVATE HEALTH INSURANCE

## 2019-06-27 DIAGNOSIS — Z1159 Encounter for screening for other viral diseases: Secondary | ICD-10-CM

## 2019-06-30 LAB — SARS CORONAVIRUS 2 (TAT 6-24 HRS): SARS Coronavirus 2: NEGATIVE

## 2019-07-01 ENCOUNTER — Ambulatory Visit (AMBULATORY_SURGERY_CENTER): Payer: PRIVATE HEALTH INSURANCE | Admitting: Gastroenterology

## 2019-07-01 ENCOUNTER — Encounter: Payer: Self-pay | Admitting: Gastroenterology

## 2019-07-01 ENCOUNTER — Other Ambulatory Visit: Payer: Self-pay

## 2019-07-01 VITALS — BP 115/44 | HR 68 | Temp 97.1°F | Resp 18 | Ht 68.0 in | Wt 248.0 lb

## 2019-07-01 DIAGNOSIS — K625 Hemorrhage of anus and rectum: Secondary | ICD-10-CM

## 2019-07-01 DIAGNOSIS — K449 Diaphragmatic hernia without obstruction or gangrene: Secondary | ICD-10-CM | POA: Diagnosis not present

## 2019-07-01 DIAGNOSIS — R1032 Left lower quadrant pain: Secondary | ICD-10-CM

## 2019-07-01 DIAGNOSIS — R1319 Other dysphagia: Secondary | ICD-10-CM

## 2019-07-01 DIAGNOSIS — R1310 Dysphagia, unspecified: Secondary | ICD-10-CM | POA: Diagnosis not present

## 2019-07-01 DIAGNOSIS — K319 Disease of stomach and duodenum, unspecified: Secondary | ICD-10-CM

## 2019-07-01 DIAGNOSIS — K219 Gastro-esophageal reflux disease without esophagitis: Secondary | ICD-10-CM | POA: Diagnosis not present

## 2019-07-01 DIAGNOSIS — K2970 Gastritis, unspecified, without bleeding: Secondary | ICD-10-CM | POA: Diagnosis not present

## 2019-07-01 DIAGNOSIS — K635 Polyp of colon: Secondary | ICD-10-CM | POA: Diagnosis not present

## 2019-07-01 DIAGNOSIS — D125 Benign neoplasm of sigmoid colon: Secondary | ICD-10-CM

## 2019-07-01 DIAGNOSIS — K3189 Other diseases of stomach and duodenum: Secondary | ICD-10-CM

## 2019-07-01 MED ORDER — SODIUM CHLORIDE 0.9 % IV SOLN: 500.0000 mL | Freq: Once | INTRAVENOUS | Status: DC

## 2019-07-01 NOTE — Op Note (Signed)
[place] [facility] Patient Name: Debra Wolf Procedure Date: 07/01/2019 1:36 PM MRN: [id number] Endoscopist: Jackquline Denmark , MD Age: 49 Referring MD:  Date of Birth: 01-Dec-1970 Gender: Female Account #: [id number] Procedure:                Upper GI endoscopy Indications:              Dysphagia, GERD Medicines:                Monitored Anesthesia Care Procedure:                Pre-Anesthesia Assessment:                           - Prior to the procedure, a History and Physical                            was performed, and patient medications and                            allergies were reviewed. The patient's tolerance of                            previous anesthesia was also reviewed. The risks                            and benefits of the procedure and the sedation                            options and risks were discussed with the patient.                            All questions were answered, and informed consent                            was obtained. Prior Anticoagulants: The patient has                            taken no previous anticoagulant or antiplatelet                            agents. ASA Grade Assessment: III - A patient with                            severe systemic disease. After reviewing the risks                            and benefits, the patient was deemed in                            satisfactory condition to undergo the procedure.                           After obtaining informed consent, the endoscope was  passed under direct vision. Throughout the                            procedure, the patient's blood pressure, pulse, and                            oxygen saturations were monitored continuously. The                            Endoscope was introduced through the mouth, and                            advanced to the second part of duodenum. The upper                            GI endoscopy was accomplished without  difficulty.                            The patient tolerated the procedure well. Scope In: Scope Out: Findings:                 The esophagus was mildly tortuous but normal.                            Z-line well-defined at 38 cm. Multiple biopsies                            were obtained from proximal, mid and distal                            esophagus to rule out eosinophilic esophagitis.                            Dilation was performed with a Maloney dilator with                            mild resistance at 50 Fr. Biopsies were taken with                            a cold forceps for histology.                           A small hiatal hernia was present.                           Localized mild inflammation characterized by                            erythema was found in the gastric antrum. Biopsies                            were taken with a cold forceps for histology.  The examined duodenum was normal. Complications:            No immediate complications. Estimated Blood Loss:     Estimated blood loss: none. Impression:               - Small hiatal hernia.                           - Mild gastritis.                           - S/P empiric esophageal dilatation. Recommendation:           - Patient has a contact number available for                            emergencies. The signs and symptoms of potential                            delayed complications were discussed with the                            patient. Return to normal activities tomorrow.                            Written discharge instructions were provided to the                            patient.                           - Post dilatation diet.                           - Continue present medications.                           - Await pathology results.                           - Return to GI clinic in 12 weeks. Jackquline Denmark, MD 07/01/2019 2:26:07 PM This report has been signed  electronically.

## 2019-07-01 NOTE — Op Note (Signed)
[facility] Patient Name: Debra Wolf Procedure Date: 07/01/2019 1:36 PM MRN: [id number] Endoscopist: Jackquline Denmark , MD Age: 49 Referring MD:  Date of Birth: Jun 17, 1970 Gender: Female Account #: [id number] Procedure:                Colonoscopy Indications:              Rectal bleeding. Chronic abdominal pain with                            negative CT scan Abdo/pelvis. Medicines:                Monitored Anesthesia Care Procedure:                Pre-Anesthesia Assessment:                           - Prior to the procedure, a History and Physical                            was performed, and patient medications and                            allergies were reviewed. The patient's tolerance of                            previous anesthesia was also reviewed. The risks                            and benefits of the procedure and the sedation                            options and risks were discussed with the patient.                            All questions were answered, and informed consent                            was obtained. Prior Anticoagulants: The patient has                            taken no previous anticoagulant or antiplatelet                            agents. ASA Grade Assessment: III - A patient with                            severe systemic disease. After reviewing the risks                            and benefits, the patient was deemed in                            satisfactory condition to undergo the procedure.  After obtaining informed consent, the colonoscope                            was passed under direct vision. Throughout the                            procedure, the patient's blood pressure, pulse, and                            oxygen saturations were monitored continuously. The                            Colonoscope was introduced through the anus and                            advanced to the the cecum, identified  by                            appendiceal orifice and ileocecal valve. The                            colonoscopy was somewhat difficult due to a                            redundant colon. Successful completion of the                            procedure was aided by applying abdominal pressure.                            The patient tolerated the procedure well. The                            quality of the bowel preparation was good. The                            ileocecal valve, appendiceal orifice, and rectum                            were photographed. Scope In: 1:53:51 PM Scope Out: 2:19:37 PM Scope Withdrawal Time: 0 hours 7 minutes 10 seconds  Total Procedure Duration: 0 hours 25 minutes 46 seconds  Findings:                 Three sessile polyps were found in the distal                            sigmoid colon. The polyps were 2 to 4 mm in size.                            These polyps were removed with a cold snare.                            Resection and retrieval were complete.  A few small-mouthed diverticula were found in the                            sigmoid colon.                           Non-bleeding internal hemorrhoids were found during                            retroflexion. The hemorrhoids were small. The colon                            was redundant.                           The exam was otherwise without abnormality on                            direct and retroflexion views. Complications:            No immediate complications. Estimated Blood Loss:     Estimated blood loss: none. Impression:               - Three 2 to 4 mm polyps in the distal sigmoid                            colon, removed with a cold snare. Resected and                            retrieved.                           - Minimal sigmoid diverticulosis.                           - Non-bleeding internal hemorrhoids.                           - The examination was  otherwise normal on direct                            and retroflexion views. Highly redundant colon. Recommendation:           - Patient has a contact number available for                            emergencies. The signs and symptoms of potential                            delayed complications were discussed with the                            patient. Return to normal activities tomorrow.                            Written discharge instructions were provided to the  patient.                           - Resume previous diet.                           - Continue present medications.                           - Await pathology results.                           - Repeat colonoscopy for surveillance based on                            pathology results.                           - Miralax 1 capful (17 grams) in 8 ounces of water                            PO daily.                           - The findings and recommendations were discussed                            with the patient's family. Jackquline Denmark, MD 07/01/2019 2:30:15 PM This report has been signed electronically.

## 2019-07-01 NOTE — Progress Notes (Signed)
VS-CW  Pt's states no medical or surgical changes since previsit or office visit.  

## 2019-07-01 NOTE — Progress Notes (Signed)
Called to room to assist during endoscopic procedure.  Patient ID and intended procedure confirmed with present staff. Received instructions for my participation in the procedure from the performing physician.  

## 2019-07-01 NOTE — Progress Notes (Signed)
PT taken to PACU. Monitors in place. VSS. Report given to RN. 

## 2019-07-01 NOTE — Patient Instructions (Signed)
Please read handouts provided. Continue present medications. Follow Post Dilation Diet. Await pathology results. Return to GI clinic in 12 weeks. Miralax 1 capful ( 17 grams ) in 8 ounces of water daily.       YOU HAD AN ENDOSCOPIC PROCEDURE TODAY AT [facility]:   Refer to the procedure report that was given to you for any specific questions about what was found during the examination.  If the procedure report does not answer your questions, please call your gastroenterologist to clarify.  If you requested that your care partner not be given the details of your procedure findings, then the procedure report has been included in a sealed envelope for you to review at your convenience later.  YOU SHOULD EXPECT: Some feelings of bloating in the abdomen. Passage of more gas than usual.  Walking can help get rid of the air that was put into your GI tract during the procedure and reduce the bloating. If you had a lower endoscopy (such as a colonoscopy or flexible sigmoidoscopy) you may notice spotting of blood in your stool or on the toilet paper. If you underwent a bowel prep for your procedure, you may not have a normal bowel movement for a few days.  Please Note:  You might notice some irritation and congestion in your nose or some drainage.  This is from the oxygen used during your procedure.  There is no need for concern and it should clear up in a day or so.  SYMPTOMS TO REPORT IMMEDIATELY:   Following lower endoscopy (colonoscopy or flexible sigmoidoscopy):  Excessive amounts of blood in the stool  Significant tenderness or worsening of abdominal pains  Swelling of the abdomen that is new, acute  Fever of 100F or higher   Following upper endoscopy (EGD)  Vomiting of blood or coffee ground material  New chest pain or pain under the shoulder blades  Painful or persistently difficult swallowing  New shortness of breath  Fever of 100F or higher  Black, tarry-looking  stools  For urgent or emergent issues, a gastroenterologist can be reached at any hour by calling [phone]. Do not use MyChart messaging for urgent concerns.    DIET:  Drink plenty of fluids but you should avoid alcoholic beverages for 24 hours.  ACTIVITY:  You should plan to take it easy for the rest of today and you should NOT DRIVE or use heavy machinery until tomorrow (because of the sedation medicines used during the test).    FOLLOW UP: Our staff will call the number listed on your records 48-72 hours following your procedure to check on you and address any questions or concerns that you may have regarding the information given to you following your procedure. If we do not reach you, we will leave a message.  We will attempt to reach you two times.  During this call, we will ask if you have developed any symptoms of COVID 19. If you develop any symptoms (ie: fever, flu-like symptoms, shortness of breath, cough etc.) before then, please call [phone].  If you test positive for Covid 19 in the 2 weeks post procedure, please call and report this information to [place].    If any biopsies were taken you will be contacted by phone or by letter within the next 1-3 weeks.  Please call us at [phone] if you have not heard about the biopsies in 3 weeks.    SIGNATURES/CONFIDENTIALITY: You and/or your care partner have signed paperwork  which will be entered into your electronic medical record.  These signatures attest to the fact that that the information above on your After Visit Summary has been reviewed and is understood.  Full responsibility of the confidentiality of this discharge information lies with you and/or your care-partner.

## 2019-07-03 ENCOUNTER — Telehealth: Payer: Self-pay

## 2019-07-03 NOTE — Telephone Encounter (Signed)
  Follow up Call-  Call back number 07/01/2019  Post procedure Call Back phone  # [phone]  Permission to leave phone message Yes  Some recent data might be hidden     Patient questions:  Do you have a fever, pain , or abdominal swelling? No. Pain Score  0 *  Have you tolerated food without any problems? Yes.    Have you been able to return to your normal activities? Yes.    Do you have any questions about your discharge instructions: Diet   No. Medications  No. Follow up visit  No.  Do you have questions or concerns about your Care? No.  Actions: * If pain score is 4 or above: No action needed, pain <4.  1. Have you developed a fever since your procedure? No  2.   Have you had an respiratory symptoms (SOB or cough) since your procedure? No 3.   Have you tested positive for COVID 19 since your procedure No  4.   Have you had any family members/close contacts diagnosed with the COVID 19 since your procedure?  No   If yes to any of these questions please route to Joylene John, RN and Erenest Rasher, RN

## 2019-07-06 ENCOUNTER — Encounter: Payer: Self-pay | Admitting: Gastroenterology

## 2019-07-15 ENCOUNTER — Telehealth: Payer: Self-pay

## 2019-07-15 NOTE — Telephone Encounter (Signed)
LVM for patient to call back  12 week follow up is for 09-2019 patient can make appointment when it is best for her considering we dont have the full schedule for September yet

## 2019-07-15 NOTE — Telephone Encounter (Signed)
Appointment scheduled wpt for 09-23-19. Wanted to know if her nausea symptoms are normall and part of her diverticulitis? Is there some advice for her to feel better?

## 2019-07-15 NOTE — Telephone Encounter (Signed)
Spoke to patient who states that she is having intermittent nausea and vomits. This is more noted when she is constipated due to IBS-C. Patient takes Miralax and colace for constipation. She stated that at the time of her procedure it was recommended to try OTC IBgard and a probiotic. Patient will try and contact our office in a few days to report her response. She has a follow up appointment in September post EGD.

## 2019-07-21 ENCOUNTER — Other Ambulatory Visit: Payer: Self-pay

## 2019-07-21 ENCOUNTER — Encounter: Payer: Self-pay | Admitting: Cardiology

## 2019-07-21 ENCOUNTER — Ambulatory Visit (INDEPENDENT_AMBULATORY_CARE_PROVIDER_SITE_OTHER): Payer: PRIVATE HEALTH INSURANCE | Admitting: Cardiology

## 2019-07-21 ENCOUNTER — Ambulatory Visit: Payer: PRIVATE HEALTH INSURANCE | Admitting: Cardiology

## 2019-07-21 VITALS — BP 122/70 | HR 69 | Ht 68.0 in | Wt 252.8 lb

## 2019-07-21 DIAGNOSIS — E119 Type 2 diabetes mellitus without complications: Secondary | ICD-10-CM | POA: Diagnosis not present

## 2019-07-21 DIAGNOSIS — R072 Precordial pain: Secondary | ICD-10-CM

## 2019-07-21 DIAGNOSIS — E782 Mixed hyperlipidemia: Secondary | ICD-10-CM

## 2019-07-21 DIAGNOSIS — E038 Other specified hypothyroidism: Secondary | ICD-10-CM

## 2019-07-21 DIAGNOSIS — I491 Atrial premature depolarization: Secondary | ICD-10-CM

## 2019-07-21 DIAGNOSIS — I10 Essential (primary) hypertension: Secondary | ICD-10-CM

## 2019-07-21 DIAGNOSIS — E669 Obesity, unspecified: Secondary | ICD-10-CM

## 2019-07-21 DIAGNOSIS — R0602 Shortness of breath: Secondary | ICD-10-CM

## 2019-07-21 MED ORDER — METOPROLOL SUCCINATE ER 25 MG PO TB24
12.5000 mg | ORAL_TABLET | Freq: Every day | ORAL | 3 refills | Status: DC
Start: 2019-07-21 — End: 2019-07-28

## 2019-07-21 NOTE — Progress Notes (Signed)
Cardiology Office Note:    Date:  07/21/2019   ID:  [name], DOB Nov 22, 1970, MRN [id number]  PCP:  [facility]  Cardiologist:  Berniece Salines, DO  Electrophysiologist:  None   Referring MD: [facility]   " I am short of breath, heart fluttering"  History of Present Illness:    Debra Wolf is a 49 y.o. female with a hx of diabetes, hypertension, hyperlipidemia, ectopic atrial rhythm presents today to be evaluated.  The patient tells me she experiencing significant heart fluttering.  She did wear a monitor in the past which showed ectopic atrial rhythm at that time we will start her on low-dose beta-blocker and she mentioned that this was causing significant headache therefore we will stop the medication.  With this we will switch her to Cardizem 120 mg daily.  She tells me she stopped taking this medication as she thinks that it brought her blood pressure down.  She reports that the palpitations is still happening.  In addition she is having some left-sided chest pain as well as shortness of breath.  She is very bothered by the palpitation and will help like the symptoms are improved.  Chest pain is intermittent, she has taken nitroglycerin for this pain.  She denies any radiation of the pain.  In the last few minutes.   Past Medical History:  Diagnosis Date  . Anxiety   . Asthma   . Benign essential hypertension   . COPD (chronic obstructive pulmonary disease) ([place])   . Dyslipidemia   . Ectopic atrial rhythm   . GERD (gastroesophageal reflux disease)   . Hypothyroid   . Mass of ovary   . Neuromuscular disorder ([place])    esentrial tremors    Past Surgical History:  Procedure Laterality Date  . CHOLECYSTECTOMY    . COLONOSCOPY  2016   Dr Orlena Sheldon  . ESOPHAGOGASTRODUODENOSCOPY  10/25/2015   Mild gastritis. Minimal hiatal hernia. Otherwise, normal EGD  . PARTIAL HYSTERECTOMY    . removal of deep brain stimulator  05/16/2016   Dr. Phylliss Blakes at  [facility]  . TUBAL LIGATION    . UPPER GASTROINTESTINAL ENDOSCOPY  2004    Current Medications: Current Meds  Medication Sig  . albuterol (PROVENTIL HFA;VENTOLIN HFA) 108 (90 Base) MCG/ACT inhaler Inhale into the lungs in the morning and at bedtime.   . budesonide-formoterol (SYMBICORT) 160-4.5 MCG/ACT inhaler INHALE TWO PUFFS TWICE DAILY  . cyclobenzaprine (FLEXERIL) 10 MG tablet Take 10 mg by mouth.  . fluticasone (FLONASE) 50 MCG/ACT nasal spray Use 2 sprays in each nostril once daily  . furosemide (LASIX) 20 MG tablet Take 1 tablet (20 mg total) by mouth daily.  Marland Kitchen levothyroxine (SYNTHROID, LEVOTHROID) 150 MCG tablet Take 150 mcg by mouth daily.  . nitroGLYCERIN (NITROSTAT) 0.4 MG SL tablet DISSOLVE 1 TABLET UNDER THE TONGUE EVERY 5 MINUTES AS NEEDED FOR CHEST PAIN. DO NOT EXCEED A TOTAL OF 3 DOSES IN 15 MINUTES.  . pantoprazole (PROTONIX) 40 MG tablet Take 1 tablet (40 mg total) by mouth 2 (two) times daily.  . potassium chloride SA (KLOR-CON) 20 MEQ tablet Take 1 tablet (20 mEq total) by mouth daily. (Patient taking differently: Take 20 mEq by mouth as needed. )  . Vitamin D, Ergocalciferol, (DRISDOL) 50000 units CAPS capsule TAKE ONE CAPSULE BY MOUTH ONCE A WEEK     Allergies:   Penicillins, Vancomycin, and Cephalexin   Social History   Socioeconomic History  . Marital status: Married  Spouse name: Not on file  . Number of children: 2  . Years of education: Not on file  . Highest education level: Not on file  Occupational History  . Occupation: disabled  Tobacco Use  . Smoking status: Current Every Day Smoker    Packs/day: 1.00  . Smokeless tobacco: Never Used  Vaping Use  . Vaping Use: Never used  Substance and Sexual Activity  . Alcohol use: Not Currently  . Drug use: No  . Sexual activity: Not on file  Other Topics Concern  . Not on file  Social History Narrative  . Not on file   Social Determinants of Health   Financial Resource Strain:   . Difficulty of  Paying Living Expenses:   Food Insecurity:   . Worried About Charity fundraiser in the Last Year:   . Arboriculturist in the Last Year:   Transportation Needs:   . Film/video editor (Medical):   Marland Kitchen Lack of Transportation (Non-Medical):   Physical Activity:   . Days of Exercise per Week:   . Minutes of Exercise per Session:   Stress:   . Feeling of Stress :   Social Connections:   . Frequency of Communication with Friends and Family:   . Frequency of Social Gatherings with Friends and Family:   . Attends Religious Services:   . Active Member of Clubs or Organizations:   . Attends Archivist Meetings:   Marland Kitchen Marital Status:      Family History: The patient's family history includes Bladder Cancer in her mother; Cirrhosis in her maternal uncle; Congestive Heart Failure in her father and mother; Lung cancer in her father; Ovarian cancer in her sister. There is no history of Colon cancer, Liver cancer, Colon polyps, Esophageal cancer, Stomach cancer, or Rectal cancer.  ROS:   Review of Systems  Constitution: Negative for decreased appetite, fever and weight gain.  HENT: Negative for congestion, ear discharge, hoarse voice and sore throat.   Eyes: Negative for discharge, redness, vision loss in right eye and visual halos.  Cardiovascular: Negative for chest pain, dyspnea on exertion, leg swelling, orthopnea and palpitations.  Respiratory: Negative for cough, hemoptysis, shortness of breath and snoring.   Endocrine: Negative for heat intolerance and polyphagia.  Hematologic/Lymphatic: Negative for bleeding problem. Does not bruise/bleed easily.  Skin: Negative for flushing, nail changes, rash and suspicious lesions.  Musculoskeletal: Negative for arthritis, joint pain, muscle cramps, myalgias, neck pain and stiffness.  Gastrointestinal: Negative for abdominal pain, bowel incontinence, diarrhea and excessive appetite.  Genitourinary: Negative for decreased libido, genital  sores and incomplete emptying.  Neurological: Negative for brief paralysis, focal weakness, headaches and loss of balance.  Psychiatric/Behavioral: Negative for altered mental status, depression and suicidal ideas.  Allergic/Immunologic: Negative for HIV exposure and persistent infections.    EKGs/Labs/Other Studies Reviewed:    The following studies were reviewed today:   EKG:  The ekg ordered today demonstrates   Recent Labs: 02/03/2019: BUN 11; Creatinine, Ser 1.00; Hemoglobin 13.5; Magnesium 2.3; Platelets 286; Potassium 4.5; Sodium 141; TSH 0.581  Recent Lipid Panel No results found for: CHOL, TRIG, HDL, CHOLHDL, VLDL, LDLCALC, LDLDIRECT  Physical Exam:    VS:  BP 122/70 (BP Location: Right Arm, Patient Position: Sitting, Cuff Size: Normal)   Pulse 69   Ht 5' 8"  (1.727 m)   Wt 252 lb 12.8 oz (114.7 kg)   LMP  (LMP Unknown) Comment: partial hysterectomy  SpO2 97%   BMI 38.44  kg/m     Wt Readings from Last 3 Encounters:  07/21/19 252 lb 12.8 oz (114.7 kg)  07/01/19 248 lb (112.5 kg)  06/17/19 248 lb (112.5 kg)     GEN: Well nourished, well developed in no acute distress HEENT: Normal NECK: No JVD; No carotid bruits LYMPHATICS: No lymphadenopathy CARDIAC: S1S2 noted,RRR, no murmurs, rubs, gallops RESPIRATORY:  Clear to auscultation without rales, wheezing or rhonchi  ABDOMEN: Soft, non-tender, non-distended, +bowel sounds, no guarding. EXTREMITIES: No edema, No cyanosis, no clubbing MUSCULOSKELETAL:  No deformity  SKIN: Warm and dry NEUROLOGIC:  Alert and oriented x 3, non-focal PSYCHIATRIC:  Normal affect, good insight  ASSESSMENT:    1. Shortness of breath   2. Precordial pain   3. Diabetes mellitus without complication (HCC)   4. Ectopic atrial rhythm   5. Essential hypertension   6. Mixed hyperlipidemia   7. Obesity (BMI 30-39.9)    PLAN:     1.  I am concerned about the chest pain and shortness of breath given her significant risk factor like to  pursue an ischemic evaluation.  Coronary CTA would be appropriate in this patient.  She denies any IV contrast dye allergy.  2.  Palpitations or evidence of ectopic atrial rhythm in her monitor.  She stopped her Cardizem.  I have asked her if she wants to retry the beta-blocker again to see if this will be a much better choice she is agreeable for this.  3.  Hyperlipidemia-diet modification  4.  Bilateral leg edema continue patient on her Lasix 20 mg daily with potassium supplement.  5.  Obesity-the patient understands the need to lose weight with diet and exercise. We have discussed specific strategies for this.   6. Blood worl will be done today - includes bmp, mag, cbc, TSH and hemoglobin A1c.   The patient is in agreement with the above plan. The patient left the office in stable condition.  The patient will follow up in   Medication Adjustments/Labs and Tests Ordered: Current medicines are reviewed at length with the patient today.  Concerns regarding medicines are outlined above.  Orders Placed This Encounter  Procedures  . CT CORONARY MORPH W/CTA COR W/SCORE W/CA W/CM &/OR WO/CM  . CT CORONARY FRACTIONAL FLOW RESERVE DATA PREP  . CT CORONARY FRACTIONAL FLOW RESERVE FLUID ANALYSIS  . Basic metabolic panel  . Hemoglobin A1c  . Magnesium  . CBC with Differential/Platelet  . TSH  . EKG 12-Lead   Meds ordered this encounter  Medications  . metoprolol succinate (TOPROL XL) 25 MG 24 hr tablet    Sig: Take 0.5 tablets (12.5 mg total) by mouth daily.    Dispense:  15 tablet    Refill:  3    Patient Instructions  Medication Instructions:  Your physician has recommended you make the following change in your medication:   Start Toprol XL 12.5 mg daily.  *If you need a refill on your cardiac medications before your next appointment, please call your pharmacy*   Lab Work: Your physician recommends that you have labs done in the office today. Your test included  basic metabolic  panel, complete blood count, TSH,HgB A1C and magnesium.  If you have labs (blood work) drawn today and your tests are completely normal, you will receive your results only by: Marland Kitchen MyChart Message (if you have MyChart) OR . A paper copy in the mail If you have any lab test that is abnormal or we need to change your treatment,  we will call you to review the results.   Testing/Procedures: Your cardiac CT will be scheduled at one of the below locations:   [facility] [address] [place], [place] [address] [phone]  If scheduled at [facility], please arrive at the [facility] main entrance of [facility] 30 minutes prior to test start time. Proceed to the [facility] Radiology Department (first floor) to check-in and test prep.  Please follow these instructions carefully (unless otherwise directed):   On the Night Before the Test: . Be sure to Drink plenty of water. . Do not consume any caffeinated/decaffeinated beverages or chocolate 12 hours prior to your test. . Do not take any antihistamines 12 hours prior to your test.   On the Day of the Test: . Drink plenty of water. Do not drink any water within one hour of the test. . Do not eat any food 4 hours prior to the test. . You may take your regular medications prior to the test.  . Take metoprolol (Lopressor) two hours prior to test. . HOLD Furosemide morning of the test. . FEMALES- please wear underwire-free bra if available  After the Test: . Drink plenty of water. . After receiving IV contrast, you may experience a mild flushed feeling. This is normal. . On occasion, you may experience a mild rash up to 24 hours after the test. This is not dangerous. If this occurs, you can take Benadryl 25 mg and increase your fluid intake. . If you experience trouble breathing, this can be serious. If it is severe call 911 IMMEDIATELY. If it is mild, please call our office. . If you take any of these  medications: Glipizide/Metformin, Avandament, Glucavance, please do not take 48 hours after completing test unless otherwise instructed.   Once we have confirmed authorization from your insurance company, we will call you to set up a date and time for your test. Based on how quickly your insurance processes prior authorizations requests, please allow up to 4 weeks to be contacted for scheduling your Cardiac CT appointment. Be advised that routine Cardiac CT appointments could be scheduled as many as 8 weeks after your provider has ordered it.  For non-scheduling related questions, please contact the cardiac imaging nurse navigator should you have any questions/concerns: Marchia Bond, Cardiac Imaging Nurse Navigator Burley Saver, Interim Cardiac Imaging Nurse [facility] Direct Office Dial: [phone]   For scheduling needs, including cancellations and rescheduling, please call Vivien Rota at [phone], option 3.     Follow-Up: At [facility], you and your health needs are our priority.  As part of our continuing mission to provide you with exceptional heart care, we have created designated Provider Care Teams.  These Care Teams include your primary Cardiologist (physician) and Advanced Practice Providers (APPs -  Physician Assistants and Nurse Practitioners) who all work together to provide you with the care you need, when you need it.  We recommend signing up for the patient portal called "MyChart".  Sign up information is provided on this After Visit Summary.  MyChart is used to connect with patients for Virtual Visits (Telemedicine).  Patients are able to view lab/test results, encounter notes, upcoming appointments, etc.  Non-urgent messages can be sent to your provider as well.   To learn more about what you can do with MyChart, go to [email or web address].    Your next appointment:   1 month(s)  The format for your next appointment:  In  Person  Provider:   Berniece Salines, DO   Other Instructions Metoprolol Extended-Release Tablets What is this medicine? METOPROLOL (me TOE proe lole) is a beta blocker. It decreases the amount of work your heart has to do and helps your heart beat regularly. It treats high blood pressure and/or prevent chest pain (also called angina). It also treats heart failure. This medicine may be used for other purposes; ask your health care provider or pharmacist if you have questions. COMMON BRAND NAME(S): toprol, Toprol XL What should I tell my health care provider before I take this medicine? They need to know if you have any of these conditions:  diabetes  heart or vessel disease like slow heart rate, worsening heart failure, heart block, sick sinus syndrome or Raynaud's disease  kidney disease  liver disease  lung or breathing disease, like asthma or emphysema  pheochromocytoma  thyroid disease  an unusual or allergic reaction to metoprolol, other beta-blockers, medicines, foods, dyes, or preservatives  pregnant or trying to get pregnant  breast-feeding How should I use this medicine? Take this drug by mouth. Take it as directed on the prescription label at the same time every day. Take it with food. You may cut the tablet in half if it is scored (has a line in the middle of it). This may help you swallow the tablet if the whole tablet is too big. Be sure to take both halves. Do not take just one-half of the tablet. Keep taking it unless your health care provider tells you to stop. Talk to your health care provider about the use of this drug in children. While it may be prescribed for children as young as 6 for selected conditions, precautions do apply. Overdosage: If you think you have taken too much of this medicine contact a poison control center or emergency room at once. NOTE: This medicine is only for you. Do not share this medicine with others. What if I miss a dose? If you miss  a dose, take it as soon as you can. If it is almost time for your next dose, take only that dose. Do not take double or extra doses. What may interact with this medicine? This medicine may interact with the following medications:  certain medicines for blood pressure, heart disease, irregular heart beat  certain medicines for depression, like monoamine oxidase (MAO) inhibitors, fluoxetine, or paroxetine  clonidine  dobutamine  epinephrine  isoproterenol  reserpine This list may not describe all possible interactions. Give your health care provider a list of all the medicines, herbs, non-prescription drugs, or dietary supplements you use. Also tell them if you smoke, drink alcohol, or use illegal drugs. Some items may interact with your medicine. What should I watch for while using this medicine? Visit your doctor or health care professional for regular check ups. Contact your doctor right away if your symptoms worsen. Check your blood pressure and pulse rate regularly. Ask your health care professional what your blood pressure and pulse rate should be, and when you should contact them. You may get drowsy or dizzy. Do not drive, use machinery, or do anything that needs mental alertness until you know how this medicine affects you. Do not sit or stand up quickly, especially if you are an older patient. This reduces the risk of dizzy or fainting spells. Contact your doctor if these symptoms continue. Alcohol may interfere with the effect of this medicine. Avoid alcoholic drinks. This medicine may increase blood sugar. Ask your  healthcare provider if changes in diet or medicines are needed if you have diabetes. What side effects may I notice from receiving this medicine? Side effects that you should report to your doctor or health care professional as soon as possible:  allergic reactions like skin rash, itching or hives  cold or numb hands or feet  depression  difficulty  breathing  faint  fever with sore throat  irregular heartbeat, chest pain  rapid weight gain   signs and symptoms of high blood sugar such as being more thirsty or hungry or having to urinate more than normal. You may also feel very tired or have blurry vision.  swollen legs or ankles Side effects that usually do not require medical attention (report to your doctor or health care professional if they continue or are bothersome):  anxiety or nervousness  change in sex drive or performance  dry skin  headache  nightmares or trouble sleeping  short term memory loss  stomach upset or diarrhea This list may not describe all possible side effects. Call your doctor for medical advice about side effects. You may report side effects to FDA at 1-800-FDA-1088. Where should I keep my medicine? Keep out of the reach of children and pets. Store at room temperature between 20 and 25 degrees C (68 and 77 degrees F). Throw away any unused drug after the expiration date. NOTE: This sheet is a summary. It may not cover all possible information. If you have questions about this medicine, talk to your doctor, pharmacist, or health care provider.  2020 Elsevier/Gold Standard (2018-08-08 18:23:00)  Cardiac CT Angiogram A cardiac CT angiogram is a procedure to look at the heart and the area around the heart. It may be done to help find the cause of chest pains or other symptoms of heart disease. During this procedure, a substance called contrast dye is injected into the blood vessels in the area to be checked. A large X-ray machine, called a CT scanner, then takes detailed pictures of the heart and the surrounding area. The procedure is also sometimes called a coronary CT angiogram, coronary artery scanning, or CTA. A cardiac CT angiogram allows the health care provider to see how well blood is flowing to and from the heart. The health care provider will be able to see if there are any problems, such  as:  Blockage or narrowing of the coronary arteries in the heart.  Fluid around the heart.  Signs of weakness or disease in the muscles, valves, and tissues of the heart. Tell a health care provider about:  Any allergies you have. This is especially important if you have had a previous allergic reaction to contrast dye.  All medicines you are taking, including vitamins, herbs, eye drops, creams, and over-the-counter medicines.  Any blood disorders you have.  Any surgeries you have had.  Any medical conditions you have.  Whether you are pregnant or may be pregnant.  Any anxiety disorders, chronic pain, or other conditions you have that may increase your stress or prevent you from lying still. What are the risks? Generally, this is a safe procedure. However, problems may occur, including:  Bleeding.  Infection.  Allergic reactions to medicines or dyes.  Damage to other structures or organs.  Kidney damage from the contrast dye that is used.  Increased risk of cancer from radiation exposure. This risk is low. Talk with your health care provider about: ? The risks and benefits of testing. ? How you can receive the  lowest dose of radiation. What happens before the procedure?  Wear comfortable clothing and remove any jewelry, glasses, dentures, and hearing aids.  Follow instructions from your health care provider about eating and drinking. This may include: ? For 12 hours before the procedure -- avoid caffeine. This includes tea, coffee, soda, energy drinks, and diet pills. Drink plenty of water or other fluids that do not have caffeine in them. Being well hydrated can prevent complications. ? For 4-6 hours before the procedure -- stop eating and drinking. The contrast dye can cause nausea, but this is less likely if your stomach is empty.  Ask your health care provider about changing or stopping your regular medicines. This is especially important if you are taking diabetes  medicines, blood thinners, or medicines to treat problems with erections (erectile dysfunction). What happens during the procedure?   Hair on your chest may need to be removed so that small sticky patches called electrodes can be placed on your chest. These will transmit information that helps to monitor your heart during the procedure.  An IV will be inserted into one of your veins.  You might be given a medicine to control your heart rate during the procedure. This will help to ensure that good images are obtained.  You will be asked to lie on an exam table. This table will slide in and out of the CT machine during the procedure.  Contrast dye will be injected into the IV. You might feel warm, or you may get a metallic taste in your mouth.  You will be given a medicine called nitroglycerin. This will relax or dilate the arteries in your heart.  The table that you are lying on will move into the CT machine tunnel for the scan.  The person running the machine will give you instructions while the scans are being done. You may be asked to: ? Keep your arms above your head. ? Hold your breath. ? Stay very still, even if the table is moving.  When the scanning is complete, you will be moved out of the machine.  The IV will be removed. The procedure may vary among health care providers and hospitals. What can I expect after the procedure? After your procedure, it is common to have:  A metallic taste in your mouth from the contrast dye.  A feeling of warmth.  A headache from the nitroglycerin. Follow these instructions at home:  Take over-the-counter and prescription medicines only as told by your health care provider.  If you are told, drink enough fluid to keep your urine pale yellow. This will help to flush the contrast dye out of your body.  Most people can return to their normal activities right after the procedure. Ask your health care provider what activities are safe for  you.  It is up to you to get the results of your procedure. Ask your health care provider, or the department that is doing the procedure, when your results will be ready.  Keep all follow-up visits as told by your health care provider. This is important. Contact a health care provider if:  You have any symptoms of allergy to the contrast dye. These include: ? Shortness of breath. ? Rash or hives. ? A racing heartbeat. Summary  A cardiac CT angiogram is a procedure to look at the heart and the area around the heart. It may be done to help find the cause of chest pains or other symptoms of heart disease.  During  this procedure, a large X-ray machine, called a CT scanner, takes detailed pictures of the heart and the surrounding area after a contrast dye has been injected into blood vessels in the area.  Ask your health care provider about changing or stopping your regular medicines before the procedure. This is especially important if you are taking diabetes medicines, blood thinners, or medicines to treat erectile dysfunction.  If you are told, drink enough fluid to keep your urine pale yellow. This will help to flush the contrast dye out of your body. This information is not intended to replace advice given to you by your health care provider. Make sure you discuss any questions you have with your health care provider. Document Revised: 08/21/2018 Document Reviewed: 08/21/2018 Elsevier Patient Education  [employer].      Adopting a Healthy Lifestyle.  Know what a healthy weight is for you (roughly BMI <25) and aim to maintain this   Aim for 7+ servings of fruits and vegetables daily   65-80+ fluid ounces of water or unsweet tea for healthy kidneys   Limit to max 1 drink of alcohol per day; avoid smoking/tobacco   Limit animal fats in diet for cholesterol and heart health - choose grass fed whenever available   Avoid highly processed foods, and foods high in  saturated/trans fats   Aim for low stress - take time to unwind and care for your mental health   Aim for 150 min of moderate intensity exercise weekly for heart health, and weights twice weekly for bone health   Aim for 7-9 hours of sleep daily   When it comes to diets, agreement about the perfect plan isnt easy to find, even among the experts. Experts at the [employer] developed an idea known as the Healthy Eating Plate. Just imagine a plate divided into logical, healthy portions.   The emphasis is on diet quality:   Load up on vegetables and fruits - one-half of your plate: Aim for color and variety, and remember that potatoes dont count.   Go for whole grains - one-quarter of your plate: Whole wheat, barley, wheat berries, quinoa, oats, brown rice, and foods made with them. If you want pasta, go with whole wheat pasta.   Protein power - one-quarter of your plate: Fish, chicken, beans, and nuts are all healthy, versatile protein sources. Limit red meat.   The diet, however, does go beyond the plate, offering a few other suggestions.   Use healthy plant oils, such as olive, canola, soy, corn, sunflower and peanut. Check the labels, and avoid partially hydrogenated oil, which have unhealthy trans fats.   If youre thirsty, drink water. Coffee and tea are good in moderation, but skip sugary drinks and limit milk and dairy products to one or two daily servings.   The type of carbohydrate in the diet is more important than the amount. Some sources of carbohydrates, such as vegetables, fruits, whole grains, and beans-are healthier than others.   Finally, stay active  Signed, Berniece Salines, DO  07/21/2019 4:25 PM    [facility]

## 2019-07-21 NOTE — Patient Instructions (Addendum)
Medication Instructions:  Your physician has recommended you make the following change in your medication:   Start Toprol XL 12.5 mg daily.  *If you need a refill on your cardiac medications before your next appointment, please call your pharmacy*   Lab Work: Your physician recommends that you have labs done in the office today. Your test included  basic metabolic panel, complete blood count, TSH,HgB A1C and magnesium.  If you have labs (blood work) drawn today and your tests are completely normal, you will receive your results only by: Marland Kitchen MyChart Message (if you have MyChart) OR . A paper copy in the mail If you have any lab test that is abnormal or we need to change your treatment, we will call you to review the results.   Testing/Procedures: Your cardiac CT will be scheduled at one of the below locations:   [facility] [address] [place], [place] [address] [phone]  If scheduled at [facility], please arrive at the [facility] main entrance of [facility] 30 minutes prior to test start time. Proceed to the [facility] Radiology Department (first floor) to check-in and test prep.  Please follow these instructions carefully (unless otherwise directed):   On the Night Before the Test: . Be sure to Drink plenty of water. . Do not consume any caffeinated/decaffeinated beverages or chocolate 12 hours prior to your test. . Do not take any antihistamines 12 hours prior to your test.   On the Day of the Test: . Drink plenty of water. Do not drink any water within one hour of the test. . Do not eat any food 4 hours prior to the test. . You may take your regular medications prior to the test.  . Take metoprolol (Lopressor) two hours prior to test. . HOLD Furosemide morning of the test. . FEMALES- please wear underwire-free bra if available  After the Test: . Drink plenty of water. . After receiving IV contrast, you may experience a mild  flushed feeling. This is normal. . On occasion, you may experience a mild rash up to 24 hours after the test. This is not dangerous. If this occurs, you can take Benadryl 25 mg and increase your fluid intake. . If you experience trouble breathing, this can be serious. If it is severe call 911 IMMEDIATELY. If it is mild, please call our office. . If you take any of these medications: Glipizide/Metformin, Avandament, Glucavance, please do not take 48 hours after completing test unless otherwise instructed.   Once we have confirmed authorization from your insurance company, we will call you to set up a date and time for your test. Based on how quickly your insurance processes prior authorizations requests, please allow up to 4 weeks to be contacted for scheduling your Cardiac CT appointment. Be advised that routine Cardiac CT appointments could be scheduled as many as 8 weeks after your provider has ordered it.  For non-scheduling related questions, please contact the cardiac imaging nurse navigator should you have any questions/concerns: Marchia Bond, Cardiac Imaging Nurse Navigator Burley Saver, Interim Cardiac Imaging Nurse [facility] Direct Office Dial: [phone]   For scheduling needs, including cancellations and rescheduling, please call Vivien Rota at [phone], option [phone].     Follow-Up: At [facility], you and your health needs are our priority.  As part of our continuing mission to provide you with exceptional heart care, we have created designated Provider Care Teams.  These Care Teams  include your primary Cardiologist (physician) and Advanced Practice Providers (APPs -  Physician Assistants and Nurse Practitioners) who all work together to provide you with the care you need, when you need it.  We recommend signing up for the patient portal called "MyChart".  Sign up information is provided on this After Visit Summary.  MyChart is used to connect with  patients for Virtual Visits (Telemedicine).  Patients are able to view lab/test results, encounter notes, upcoming appointments, etc.  Non-urgent messages can be sent to your provider as well.   To learn more about what you can do with MyChart, go to [email or web address].    Your next appointment:   1 month(s)  The format for your next appointment:   In Person  Provider:   Berniece Salines, DO   Other Instructions Metoprolol Extended-Release Tablets What is this medicine? METOPROLOL (me TOE proe lole) is a beta blocker. It decreases the amount of work your heart has to do and helps your heart beat regularly. It treats high blood pressure and/or prevent chest pain (also called angina). It also treats heart failure. This medicine may be used for other purposes; ask your health care provider or pharmacist if you have questions. COMMON BRAND NAME(S): toprol, Toprol XL What should I tell my health care provider before I take this medicine? They need to know if you have any of these conditions:  diabetes  heart or vessel disease like slow heart rate, worsening heart failure, heart block, sick sinus syndrome or Raynaud's disease  kidney disease  liver disease  lung or breathing disease, like asthma or emphysema  pheochromocytoma  thyroid disease  an unusual or allergic reaction to metoprolol, other beta-blockers, medicines, foods, dyes, or preservatives  pregnant or trying to get pregnant  breast-feeding How should I use this medicine? Take this drug by mouth. Take it as directed on the prescription label at the same time every day. Take it with food. You may cut the tablet in half if it is scored (has a line in the middle of it). This may help you swallow the tablet if the whole tablet is too big. Be sure to take both halves. Do not take just one-half of the tablet. Keep taking it unless your health care provider tells you to stop. Talk to your health care provider about the use of  this drug in children. While it may be prescribed for children as young as 6 for selected conditions, precautions do apply. Overdosage: If you think you have taken too much of this medicine contact a poison control center or emergency room at once. NOTE: This medicine is only for you. Do not share this medicine with others. What if I miss a dose? If you miss a dose, take it as soon as you can. If it is almost time for your next dose, take only that dose. Do not take double or extra doses. What may interact with this medicine? This medicine may interact with the following medications:  certain medicines for blood pressure, heart disease, irregular heart beat  certain medicines for depression, like monoamine oxidase (MAO) inhibitors, fluoxetine, or paroxetine  clonidine  dobutamine  epinephrine  isoproterenol  reserpine This list may not describe all possible interactions. Give your health care provider a list of all the medicines, herbs, non-prescription drugs, or dietary supplements you use. Also tell them if you smoke, drink alcohol, or use illegal drugs. Some items may interact with your medicine. What should I watch for while using this  medicine? Visit your doctor or health care professional for regular check ups. Contact your doctor right away if your symptoms worsen. Check your blood pressure and pulse rate regularly. Ask your health care professional what your blood pressure and pulse rate should be, and when you should contact them. You may get drowsy or dizzy. Do not drive, use machinery, or do anything that needs mental alertness until you know how this medicine affects you. Do not sit or stand up quickly, especially if you are an older patient. This reduces the risk of dizzy or fainting spells. Contact your doctor if these symptoms continue. Alcohol may interfere with the effect of this medicine. Avoid alcoholic drinks. This medicine may increase blood sugar. Ask your healthcare  provider if changes in diet or medicines are needed if you have diabetes. What side effects may I notice from receiving this medicine? Side effects that you should report to your doctor or health care professional as soon as possible:  allergic reactions like skin rash, itching or hives  cold or numb hands or feet  depression  difficulty breathing  faint  fever with sore throat  irregular heartbeat, chest pain  rapid weight gain   signs and symptoms of high blood sugar such as being more thirsty or hungry or having to urinate more than normal. You may also feel very tired or have blurry vision.  swollen legs or ankles Side effects that usually do not require medical attention (report to your doctor or health care professional if they continue or are bothersome):  anxiety or nervousness  change in sex drive or performance  dry skin  headache  nightmares or trouble sleeping  short term memory loss  stomach upset or diarrhea This list may not describe all possible side effects. Call your doctor for medical advice about side effects. You may report side effects to FDA at 1-800-FDA-1088. Where should I keep my medicine? Keep out of the reach of children and pets. Store at room temperature between 20 and 25 degrees C (68 and 77 degrees F). Throw away any unused drug after the expiration date. NOTE: This sheet is a summary. It may not cover all possible information. If you have questions about this medicine, talk to your doctor, pharmacist, or health care provider.  2020 Elsevier/Gold Standard (2018-08-08 18:23:00)  Cardiac CT Angiogram A cardiac CT angiogram is a procedure to look at the heart and the area around the heart. It may be done to help find the cause of chest pains or other symptoms of heart disease. During this procedure, a substance called contrast dye is injected into the blood vessels in the area to be checked. A large X-ray machine, called a CT scanner, then  takes detailed pictures of the heart and the surrounding area. The procedure is also sometimes called a coronary CT angiogram, coronary artery scanning, or CTA. A cardiac CT angiogram allows the health care provider to see how well blood is flowing to and from the heart. The health care provider will be able to see if there are any problems, such as:  Blockage or narrowing of the coronary arteries in the heart.  Fluid around the heart.  Signs of weakness or disease in the muscles, valves, and tissues of the heart. Tell a health care provider about:  Any allergies you have. This is especially important if you have had a previous allergic reaction to contrast dye.  All medicines you are taking, including vitamins, herbs, eye drops, creams, and over-the-counter  medicines.  Any blood disorders you have.  Any surgeries you have had.  Any medical conditions you have.  Whether you are pregnant or may be pregnant.  Any anxiety disorders, chronic pain, or other conditions you have that may increase your stress or prevent you from lying still. What are the risks? Generally, this is a safe procedure. However, problems may occur, including:  Bleeding.  Infection.  Allergic reactions to medicines or dyes.  Damage to other structures or organs.  Kidney damage from the contrast dye that is used.  Increased risk of cancer from radiation exposure. This risk is low. Talk with your health care provider about: ? The risks and benefits of testing. ? How you can receive the lowest dose of radiation. What happens before the procedure?  Wear comfortable clothing and remove any jewelry, glasses, dentures, and hearing aids.  Follow instructions from your health care provider about eating and drinking. This may include: ? For 12 hours before the procedure -- avoid caffeine. This includes tea, coffee, soda, energy drinks, and diet pills. Drink plenty of water or other fluids that do not have caffeine  in them. Being well hydrated can prevent complications. ? For 4-6 hours before the procedure -- stop eating and drinking. The contrast dye can cause nausea, but this is less likely if your stomach is empty.  Ask your health care provider about changing or stopping your regular medicines. This is especially important if you are taking diabetes medicines, blood thinners, or medicines to treat problems with erections (erectile dysfunction). What happens during the procedure?   Hair on your chest may need to be removed so that small sticky patches called electrodes can be placed on your chest. These will transmit information that helps to monitor your heart during the procedure.  An IV will be inserted into one of your veins.  You might be given a medicine to control your heart rate during the procedure. This will help to ensure that good images are obtained.  You will be asked to lie on an exam table. This table will slide in and out of the CT machine during the procedure.  Contrast dye will be injected into the IV. You might feel warm, or you may get a metallic taste in your mouth.  You will be given a medicine called nitroglycerin. This will relax or dilate the arteries in your heart.  The table that you are lying on will move into the CT machine tunnel for the scan.  The person running the machine will give you instructions while the scans are being done. You may be asked to: ? Keep your arms above your head. ? Hold your breath. ? Stay very still, even if the table is moving.  When the scanning is complete, you will be moved out of the machine.  The IV will be removed. The procedure may vary among health care providers and hospitals. What can I expect after the procedure? After your procedure, it is common to have:  A metallic taste in your mouth from the contrast dye.  A feeling of warmth.  A headache from the nitroglycerin. Follow these instructions at home:  Take  over-the-counter and prescription medicines only as told by your health care provider.  If you are told, drink enough fluid to keep your urine pale yellow. This will help to flush the contrast dye out of your body.  Most people can return to their normal activities right after the procedure. Ask your health care  provider what activities are safe for you.  It is up to you to get the results of your procedure. Ask your health care provider, or the department that is doing the procedure, when your results will be ready.  Keep all follow-up visits as told by your health care provider. This is important. Contact a health care provider if:  You have any symptoms of allergy to the contrast dye. These include: ? Shortness of breath. ? Rash or hives. ? A racing heartbeat. Summary  A cardiac CT angiogram is a procedure to look at the heart and the area around the heart. It may be done to help find the cause of chest pains or other symptoms of heart disease.  During this procedure, a large X-ray machine, called a CT scanner, takes detailed pictures of the heart and the surrounding area after a contrast dye has been injected into blood vessels in the area.  Ask your health care provider about changing or stopping your regular medicines before the procedure. This is especially important if you are taking diabetes medicines, blood thinners, or medicines to treat erectile dysfunction.  If you are told, drink enough fluid to keep your urine pale yellow. This will help to flush the contrast dye out of your body. This information is not intended to replace advice given to you by your health care provider. Make sure you discuss any questions you have with your health care provider. Document Revised: 08/21/2018 Document Reviewed: 08/21/2018 Elsevier Patient Education  [employer].

## 2019-07-22 LAB — CBC WITH DIFFERENTIAL/PLATELET
Basophils Absolute: 0.1 10*3/uL (ref 0.0–0.2)
Basos: 1 %
EOS (ABSOLUTE): 0.4 10*3/uL (ref 0.0–0.4)
Eos: 3 %
Hematocrit: 36.7 % (ref 34.0–46.6)
Hemoglobin: 12.4 g/dL (ref 11.1–15.9)
Immature Grans (Abs): 0 10*3/uL (ref 0.0–0.1)
Immature Granulocytes: 0 %
Lymphocytes Absolute: 3 10*3/uL (ref 0.7–3.1)
Lymphs: 29 %
MCH: 29.5 pg (ref 26.6–33.0)
MCHC: 33.8 g/dL (ref 31.5–35.7)
MCV: 87 fL (ref 79–97)
Monocytes Absolute: 0.6 10*3/uL (ref 0.1–0.9)
Monocytes: 6 %
Neutrophils Absolute: 6.3 10*3/uL (ref 1.4–7.0)
Neutrophils: 61 %
Platelets: 248 10*3/uL (ref 150–450)
RBC: 4.21 x10E6/uL (ref 3.77–5.28)
RDW: 13.9 % (ref 11.7–15.4)
WBC: 10.3 10*3/uL (ref 3.4–10.8)

## 2019-07-22 LAB — BASIC METABOLIC PANEL
BUN/Creatinine Ratio: 12 (ref 9–23)
BUN: 12 mg/dL (ref 6–24)
CO2: 25 mmol/L (ref 20–29)
Calcium: 9.2 mg/dL (ref 8.7–10.2)
Chloride: 101 mmol/L (ref 96–106)
Creatinine, Ser: 0.99 mg/dL (ref 0.57–1.00)
GFR calc Af Amer: 76 mL/min/{1.73_m2} (ref >59)
GFR calc non Af Amer: 66 mL/min/{1.73_m2} (ref >59)
Glucose: 99 mg/dL (ref 65–99)
Potassium: 4.4 mmol/L (ref 3.5–5.2)
Sodium: 138 mmol/L (ref 134–144)

## 2019-07-22 LAB — TSH: TSH: 0.511 u[IU]/mL (ref 0.450–4.500)

## 2019-07-22 LAB — MAGNESIUM: Magnesium: 1.9 mg/dL (ref 1.6–2.3)

## 2019-07-22 LAB — HEMOGLOBIN A1C
Est. average glucose Bld gHb Est-mCnc: 103 mg/dL
Hgb A1c MFr Bld: 5.2 % (ref 4.8–5.6)

## 2019-07-28 MED ORDER — CARVEDILOL 6.25 MG PO TABS
6.2500 mg | ORAL_TABLET | Freq: Two times a day (BID) | ORAL | 3 refills | Status: DC
Start: 2019-07-28 — End: 2019-10-20

## 2019-08-28 ENCOUNTER — Encounter: Payer: Self-pay | Admitting: Cardiology

## 2019-08-28 ENCOUNTER — Other Ambulatory Visit: Payer: Self-pay

## 2019-08-28 ENCOUNTER — Ambulatory Visit (INDEPENDENT_AMBULATORY_CARE_PROVIDER_SITE_OTHER): Payer: PRIVATE HEALTH INSURANCE | Admitting: Cardiology

## 2019-08-28 VITALS — BP 130/70 | HR 66 | Ht 68.0 in | Wt 251.0 lb

## 2019-08-28 DIAGNOSIS — E0800 Diabetes mellitus due to underlying condition with hyperosmolarity without nonketotic hyperglycemic-hyperosmolar coma (NKHHC): Secondary | ICD-10-CM

## 2019-08-28 DIAGNOSIS — R0600 Dyspnea, unspecified: Secondary | ICD-10-CM | POA: Diagnosis not present

## 2019-08-28 DIAGNOSIS — F17200 Nicotine dependence, unspecified, uncomplicated: Secondary | ICD-10-CM | POA: Diagnosis not present

## 2019-08-28 DIAGNOSIS — R0609 Other forms of dyspnea: Secondary | ICD-10-CM

## 2019-08-28 DIAGNOSIS — E782 Mixed hyperlipidemia: Secondary | ICD-10-CM

## 2019-08-28 DIAGNOSIS — R072 Precordial pain: Secondary | ICD-10-CM

## 2019-08-28 DIAGNOSIS — I491 Atrial premature depolarization: Secondary | ICD-10-CM | POA: Diagnosis not present

## 2019-08-28 MED ORDER — FUROSEMIDE 40 MG PO TABS
40.0000 mg | ORAL_TABLET | Freq: Every day | ORAL | 1 refills | Status: DC
Start: 2019-08-28 — End: 2020-07-01

## 2019-08-28 NOTE — Progress Notes (Signed)
Cardiology Office Note:    Date:  08/28/2019   ID:  Debra Wolf, DOB 08/04/1970, MRN [id number]  PCP:  [facility]  Cardiologist:  Berniece Salines, DO  Electrophysiologist:  None   Referring MD: [facility] [place]   Chief Complaint  Patient presents with  . Follow-up  I am still having some leg swelling  History of Present Illness:    Debra Wolf is a 49 y.o. female with a hx of diabetes, hypertension, hyperlipidemia, ectopic atrial rhythm presents today for follow-up visit.  I last saw the patient on July 21, 2019 at that time the patient experienced a significant problem with the Cardizem therefore we will stop the medication and start her Toprol-XL.  In the interim she called me that the Toprol XL was making her significantly dizzy and fatigue.  She also did have some diarrhea.  At that time I stopped the Toprol-XL and started the patient on carvedilol 6.25 mg twice a day.  She is here today for follow-up visit she tells me that she is doing better on the carvedilol even though she has some breakthroughs palpitations.  She is concerned as she is having intermittent chest pain still.  Her CT scan is pending.  She also reported that she is still having bilateral leg edema I think that her Lasix is not helping as much.  Past Medical History:  Diagnosis Date  . Anxiety   . Asthma   . Benign essential hypertension   . COPD (chronic obstructive pulmonary disease) ([place])   . Dyslipidemia   . Ectopic atrial rhythm   . GERD (gastroesophageal reflux disease)   . Hypothyroid   . Mass of ovary   . Neuromuscular disorder ([place])    esentrial tremors    Past Surgical History:  Procedure Laterality Date  . CHOLECYSTECTOMY    . COLONOSCOPY  2016   Dr Orlena Sheldon  . ESOPHAGOGASTRODUODENOSCOPY  10/25/2015   Mild gastritis. Minimal hiatal hernia. Otherwise, normal EGD  . PARTIAL HYSTERECTOMY    . removal of deep brain stimulator  05/16/2016   Dr. Phylliss Blakes at [facility]  . TUBAL  LIGATION    . UPPER GASTROINTESTINAL ENDOSCOPY  2004    Current Medications: Current Meds  Medication Sig  . albuterol (PROVENTIL HFA;VENTOLIN HFA) 108 (90 Base) MCG/ACT inhaler Inhale into the lungs in the morning and at bedtime.   . budesonide-formoterol (SYMBICORT) 160-4.5 MCG/ACT inhaler INHALE TWO PUFFS TWICE DAILY  . carvedilol (COREG) 6.25 MG tablet Take 1 tablet (6.25 mg total) by mouth 2 (two) times daily.  . cyclobenzaprine (FLEXERIL) 10 MG tablet Take 10 mg by mouth.  . fluticasone (FLONASE) 50 MCG/ACT nasal spray Use 2 sprays in each nostril once daily  . furosemide (LASIX) 20 MG tablet Take 1 tablet (20 mg total) by mouth daily.  Marland Kitchen levothyroxine (SYNTHROID, LEVOTHROID) 150 MCG tablet Take 150 mcg by mouth daily.  . nitroGLYCERIN (NITROSTAT) 0.4 MG SL tablet DISSOLVE 1 TABLET UNDER THE TONGUE EVERY 5 MINUTES AS NEEDED FOR CHEST PAIN. DO NOT EXCEED A TOTAL OF 3 DOSES IN 15 MINUTES.  . pantoprazole (PROTONIX) 40 MG tablet Take 1 tablet (40 mg total) by mouth 2 (two) times daily.  . potassium chloride SA (KLOR-CON) 20 MEQ tablet Take 1 tablet (20 mEq total) by mouth daily. (Patient taking differently: Take 20 mEq by mouth as needed. )  . Vitamin D, Ergocalciferol, (DRISDOL) 50000 units CAPS capsule TAKE ONE CAPSULE BY MOUTH ONCE A WEEK  . [DISCONTINUED]  diltiazem (CARDIZEM LA) 120 MG 24 hr tablet Take 1 tablet (120 mg total) by mouth daily.     Allergies:   Penicillins, Vancomycin, and Cephalexin   Social History   Socioeconomic History  . Marital status: Married    Spouse name: Not on file  . Number of children: 2  . Years of education: Not on file  . Highest education level: Not on file  Occupational History  . Occupation: disabled  Tobacco Use  . Smoking status: Current Every Day Smoker    Packs/day: 1.00  . Smokeless tobacco: Never Used  Vaping Use  . Vaping Use: Never used  Substance and Sexual Activity  . Alcohol use: Not Currently  . Drug use: No  . Sexual  activity: Not on file  Other Topics Concern  . Not on file  Social History Narrative  . Not on file   Social Determinants of Health   Financial Resource Strain:   . Difficulty of Paying Living Expenses: Not on file  Food Insecurity:   . Worried About Charity fundraiser in the Last Year: Not on file  . Ran Out of Food in the Last Year: Not on file  Transportation Needs:   . Lack of Transportation (Medical): Not on file  . Lack of Transportation (Non-Medical): Not on file  Physical Activity:   . Days of Exercise per Week: Not on file  . Minutes of Exercise per Session: Not on file  Stress:   . Feeling of Stress : Not on file  Social Connections:   . Frequency of Communication with Friends and Family: Not on file  . Frequency of Social Gatherings with Friends and Family: Not on file  . Attends Religious Services: Not on file  . Active Member of Clubs or Organizations: Not on file  . Attends Archivist Meetings: Not on file  . Marital Status: Not on file     Family History: The patient's family history includes Bladder Cancer in her mother; Cirrhosis in her maternal uncle; Congestive Heart Failure in her father and mother; Lung cancer in her father; Ovarian cancer in her sister. There is no history of Colon cancer, Liver cancer, Colon polyps, Esophageal cancer, Stomach cancer, or Rectal cancer.  ROS:   Review of Systems  Constitution: Negative for decreased appetite, fever and weight gain.  HENT: Negative for congestion, ear discharge, hoarse voice and sore throat.   Eyes: Negative for discharge, redness, vision loss in right eye and visual halos.  Cardiovascular: Negative for chest pain, dyspnea on exertion, leg swelling, orthopnea and palpitations.  Respiratory: Negative for cough, hemoptysis, shortness of breath and snoring.   Endocrine: Negative for heat intolerance and polyphagia.  Hematologic/Lymphatic: Negative for bleeding problem. Does not bruise/bleed easily.   Skin: Negative for flushing, nail changes, rash and suspicious lesions.  Musculoskeletal: Negative for arthritis, joint pain, muscle cramps, myalgias, neck pain and stiffness.  Gastrointestinal: Negative for abdominal pain, bowel incontinence, diarrhea and excessive appetite.  Genitourinary: Negative for decreased libido, genital sores and incomplete emptying.  Neurological: Negative for brief paralysis, focal weakness, headaches and loss of balance.  Psychiatric/Behavioral: Negative for altered mental status, depression and suicidal ideas.  Allergic/Immunologic: Negative for HIV exposure and persistent infections.    EKGs/Labs/Other Studies Reviewed:    The following studies were reviewed today:   EKG: None today  Recent Labs: 07/21/2019: BUN 12; Creatinine, Ser 0.99; Hemoglobin 12.4; Magnesium 1.9; Platelets 248; Potassium 4.4; Sodium 138; TSH 0.511  Recent Lipid Panel  No results found for: CHOL, TRIG, HDL, CHOLHDL, VLDL, LDLCALC, LDLDIRECT  Physical Exam:    VS:  BP 130/70 (BP Location: Right Arm, Patient Position: Sitting, Cuff Size: Large)   Pulse 66   Ht 5\' 8"  (1.727 m)   Wt 251 lb (113.9 kg)   LMP  (LMP Unknown) Comment: partial hysterectomy  SpO2 97%   BMI 38.16 kg/m     Wt Readings from Last 3 Encounters:  08/28/19 251 lb (113.9 kg)  07/21/19 252 lb 12.8 oz (114.7 kg)  07/01/19 248 lb (112.5 kg)     GEN: Well nourished, well developed in no acute distress HEENT: Normal NECK: No JVD; No carotid bruits LYMPHATICS: No lymphadenopathy CARDIAC: S1S2 noted,RRR, no murmurs, rubs, gallops RESPIRATORY:  Clear to auscultation without rales, wheezing or rhonchi  ABDOMEN: Soft, non-tender, non-distended, +bowel sounds, no guarding. EXTREMITIES: No edema, No cyanosis, no clubbing MUSCULOSKELETAL:  No deformity  SKIN: Warm and dry NEUROLOGIC:  Alert and oriented x 3, non-focal PSYCHIATRIC:  Normal affect, good insight  ASSESSMENT:    1. Ectopic atrial rhythm   2.  Diabetes mellitus due to underlying condition with hyperosmolarity without coma, without long-term current use of insulin ([place])   3. Tobacco use disorder   4. DOE (dyspnea on exertion)   5. Mixed hyperlipidemia   6. Precordial pain    PLAN:     I am going to increase the patient Lasix to 40 mg daily.  To help alleviate will help with her leg edema.  She is not taking her potassium supplement as she is supposed to daily.  She tells me she takes it once a day have educated the patient especially now that I am increasing her dose of Lasix I really would like her to take her potassium supplement daily.  In terms of her chest pain she has not needed any nitroglycerin.  Her cardiac CTA is pending.  Her shortness of breath may be multifactorial given that the patient does have a COPD, is obese.  Chest pain -she is currently chest pain.  Her CTA is pending next week.  Looking forward to getting these results back and be able to share this with the patient.  The meantime she has sublingual nitroglycerin.  Blood work will be done today to assess BMP and magnesium.   The patient is in agreement with the above plan. The patient left the office in stable condition.  The patient will follow up in 3 months or sooner if needed.   Medication Adjustments/Labs and Tests Ordered: Current medicines are reviewed at length with the patient today.  Concerns regarding medicines are outlined above.  No orders of the defined types were placed in this encounter.  No orders of the defined types were placed in this encounter.   There are no Patient Instructions on file for this visit.   Adopting a Healthy Lifestyle.  Know what a healthy weight is for you (roughly BMI <25) and aim to maintain this   Aim for 7+ servings of fruits and vegetables daily   65-80+ fluid ounces of water or unsweet tea for healthy kidneys   Limit to max 1 drink of alcohol per day; avoid smoking/tobacco   Limit animal fats in diet  for cholesterol and heart health - choose grass fed whenever available   Avoid highly processed foods, and foods high in saturated/trans fats   Aim for low stress - take time to unwind and care for your mental health   Aim for  150 min of moderate intensity exercise weekly for heart health, and weights twice weekly for bone health   Aim for 7-9 hours of sleep daily   When it comes to diets, agreement about the perfect plan isnt easy to find, even among the experts. Experts at the [employer] developed an idea known as the Healthy Eating Plate. Just imagine a plate divided into logical, healthy portions.   The emphasis is on diet quality:   Load up on vegetables and fruits - one-half of your plate: Aim for color and variety, and remember that potatoes dont count.   Go for whole grains - one-quarter of your plate: Whole wheat, barley, wheat berries, quinoa, oats, brown rice, and foods made with them. If you want pasta, go with whole wheat pasta.   Protein power - one-quarter of your plate: Fish, chicken, beans, and nuts are all healthy, versatile protein sources. Limit red meat.   The diet, however, does go beyond the plate, offering a few other suggestions.   Use healthy plant oils, such as olive, canola, soy, corn, sunflower and peanut. Check the labels, and avoid partially hydrogenated oil, which have unhealthy trans fats.   If youre thirsty, drink water. Coffee and tea are good in moderation, but skip sugary drinks and limit milk and dairy products to one or two daily servings.   The type of carbohydrate in the diet is more important than the amount. Some sources of carbohydrates, such as vegetables, fruits, whole grains, and beans-are healthier than others.   Finally, stay active  Signed, Berniece Salines, DO  08/28/2019 4:27 PM    [facility]

## 2019-08-28 NOTE — Patient Instructions (Signed)
Medication Instructions:  Your physician has recommended you make the following change in your medication:  INCREASE LASIX TO 40 MG ONCE DAILY  *If you need a refill on your cardiac medications before your next appointment, please call your pharmacy*   Lab Work: Your physician recommends that you return for lab work in: Today for Bmet and Magnesium  If you have labs (blood work) drawn today and your tests are completely normal, you will receive your results only by: Marland Kitchen MyChart Message (if you have MyChart) OR . A paper copy in the mail If you have any lab test that is abnormal or we need to change your treatment, we will call you to review the results.   Testing/Procedures: NONE   Follow-Up: At [facility], you and your health needs are our priority.  As part of our continuing mission to provide you with exceptional heart care, we have created designated Provider Care Teams.  These Care Teams include your primary Cardiologist (physician) and Advanced Practice Providers (APPs -  Physician Assistants and Nurse Practitioners) who all work together to provide you with the care you need, when you need it.  We recommend signing up for the patient portal called "MyChart".  Sign up information is provided on this After Visit Summary.  MyChart is used to connect with patients for Virtual Visits (Telemedicine).  Patients are able to view lab/test results, encounter notes, upcoming appointments, etc.  Non-urgent messages can be sent to your provider as well.   To learn more about what you can do with MyChart, go to [email or web address].    Your next appointment:   3 month(s)  The format for your next appointment:   In Person  Provider:   Berniece Salines, DO   Other Instructions

## 2019-08-29 LAB — BASIC METABOLIC PANEL
BUN/Creatinine Ratio: 11 (ref 9–23)
BUN: 10 mg/dL (ref 6–24)
CO2: 25 mmol/L (ref 20–29)
Calcium: 9.2 mg/dL (ref 8.7–10.2)
Chloride: 102 mmol/L (ref 96–106)
Creatinine, Ser: 0.93 mg/dL (ref 0.57–1.00)
GFR calc Af Amer: 82 mL/min/{1.73_m2} (ref >59)
GFR calc non Af Amer: 71 mL/min/{1.73_m2} (ref >59)
Glucose: 83 mg/dL (ref 65–99)
Potassium: 4.2 mmol/L (ref 3.5–5.2)
Sodium: 140 mmol/L (ref 134–144)

## 2019-08-29 LAB — MAGNESIUM: Magnesium: 1.9 mg/dL (ref 1.6–2.3)

## 2019-09-03 ENCOUNTER — Telehealth (HOSPITAL_COMMUNITY): Payer: Self-pay | Admitting: Emergency Medicine

## 2019-09-03 NOTE — Telephone Encounter (Signed)
Reaching out to patient to offer assistance regarding upcoming cardiac imaging study; pt verbalizes understanding of appt date/time, parking situation and where to check in, pre-test NPO status and medications ordered, and verified current allergies; name and call back number provided for further questions should they arise Kieara Schwark RN Navigator Cardiac Imaging  Heart and Vascular [phone] office [phone] cell 

## 2019-09-05 ENCOUNTER — Other Ambulatory Visit: Payer: Self-pay

## 2019-09-05 ENCOUNTER — Ambulatory Visit (HOSPITAL_COMMUNITY)
Admission: RE | Admit: 2019-09-05 | Discharge: 2019-09-05 | Disposition: A | Discharge status: Home / Self Care | Payer: 59 | Source: Ambulatory Visit | Attending: Cardiology | Admitting: Cardiology

## 2019-09-05 ENCOUNTER — Encounter (HOSPITAL_COMMUNITY): Payer: Self-pay

## 2019-09-05 DIAGNOSIS — R072 Precordial pain: Secondary | ICD-10-CM | POA: Insufficient documentation

## 2019-09-05 MED ORDER — IOHEXOL 350 MG/ML SOLN
80.0000 mL | Freq: Once | INTRAVENOUS | Status: AC | PRN
  Administered 2019-09-05: 80 mL via INTRAVENOUS

## 2019-09-05 MED ORDER — NITROGLYCERIN 0.4 MG SL SUBL
SUBLINGUAL_TABLET | SUBLINGUAL | Status: AC
  Filled 2019-09-05: qty 2

## 2019-09-05 MED ORDER — NITROGLYCERIN 0.4 MG SL SUBL
0.8000 mg | SUBLINGUAL_TABLET | Freq: Once | SUBLINGUAL | Status: AC
  Administered 2019-09-05: 0.8 mg via SUBLINGUAL

## 2019-09-05 NOTE — Progress Notes (Signed)
Patient tolerated CT well. Drank water after. Ambulated to exit steady with cane.

## 2019-09-08 ENCOUNTER — Telehealth: Payer: Self-pay | Admitting: Cardiology

## 2019-09-08 MED ORDER — ATORVASTATIN CALCIUM 20 MG PO TABS: 20.0000 mg | ORAL_TABLET | Freq: Every day | ORAL | 6 refills | Status: DC

## 2019-09-08 MED ORDER — ASPIRIN EC 81 MG PO TBEC: 81.0000 mg | DELAYED_RELEASE_TABLET | Freq: Every day | ORAL | 3 refills | Status: AC

## 2019-09-08 NOTE — Addendum Note (Signed)
Addended by: Truddie Hidden on: 09/08/2019 12:13 PM   Modules accepted: Orders

## 2019-09-08 NOTE — Telephone Encounter (Signed)
Pt c/o medication issue:  1. Name of Medication: atorvastatin (LIPITOR) 20 MG tablet  2. How are you currently taking this medication (dosage and times per day)? Pt has not started taking this medication yet   3. Are you having a reaction (difficulty breathing--STAT)? no  4. What is your medication issue? Husband of patient had side effects when taking this medication. The patient has more health issues and she wanted to know if Dr. Harriet Masson would put her on another medication instead of lipitor to avoid side effects. Pt still uses [place], [place]   Patient can be reached through the MyChart portal if that is easier for the office to contact her

## 2019-09-09 NOTE — Telephone Encounter (Signed)
How do you advise?

## 2019-09-10 ENCOUNTER — Other Ambulatory Visit: Payer: Self-pay

## 2019-09-10 MED ORDER — ROSUVASTATIN CALCIUM 10 MG PO TABS: 10.0000 mg | ORAL_TABLET | Freq: Every day | ORAL | 3 refills | Status: DC

## 2019-09-23 ENCOUNTER — Ambulatory Visit: Payer: PRIVATE HEALTH INSURANCE | Admitting: Gastroenterology

## 2019-10-20 MED ORDER — CARVEDILOL 12.5 MG PO TABS: 12.5000 mg | ORAL_TABLET | Freq: Two times a day (BID) | ORAL | 3 refills | Status: DC

## 2019-10-20 MED ORDER — PROPRANOLOL HCL 10 MG PO TABS: 10.0000 mg | ORAL_TABLET | Freq: Every day | ORAL | 3 refills | Status: DC

## 2019-10-20 NOTE — Addendum Note (Signed)
Addended by: Resa Miner I on: 10/20/2019 11:03 AM   Modules accepted: Orders

## 2019-10-21 ENCOUNTER — Telehealth: Payer: Self-pay | Admitting: Cardiology

## 2019-10-21 NOTE — Telephone Encounter (Signed)
Spoke to the patient just now and let her know that she should be just taking her propranolol. She is not taking her carvedilol anymore. She verbalizes understanding and thanks me for the call back.    Encouraged patient to call back with any questions or concerns.

## 2019-10-21 NOTE — Telephone Encounter (Signed)
Pt called and wants to know if she takes the carvedilol and propranolol together. Please call to discuss

## 2019-10-24 DIAGNOSIS — J441 Chronic obstructive pulmonary disease with (acute) exacerbation: Secondary | ICD-10-CM

## 2019-10-24 DIAGNOSIS — R5383 Other fatigue: Secondary | ICD-10-CM

## 2019-10-24 DIAGNOSIS — R0902 Hypoxemia: Secondary | ICD-10-CM

## 2019-10-26 DIAGNOSIS — D72829 Elevated white blood cell count, unspecified: Secondary | ICD-10-CM | POA: Insufficient documentation

## 2019-10-26 DIAGNOSIS — F32A Depression, unspecified: Secondary | ICD-10-CM | POA: Insufficient documentation

## 2019-10-26 HISTORY — DX: Other disorders of phosphorus metabolism: E83.39

## 2019-10-26 HISTORY — DX: Elevated white blood cell count, unspecified: D72.829

## 2019-10-26 HISTORY — DX: Depression, unspecified: F32.A

## 2019-10-27 ENCOUNTER — Telehealth: Payer: Self-pay | Admitting: Cardiology

## 2019-10-27 NOTE — Telephone Encounter (Signed)
Pt just been discharged from hospital and need a closer appt I could get. Scheduled her for 10/28/19 at 10:20 am with Dr. Harriet Masson

## 2019-10-28 ENCOUNTER — Ambulatory Visit: Payer: PRIVATE HEALTH INSURANCE | Admitting: Cardiology

## 2019-10-28 DIAGNOSIS — J45909 Unspecified asthma, uncomplicated: Secondary | ICD-10-CM | POA: Insufficient documentation

## 2019-10-28 DIAGNOSIS — J449 Chronic obstructive pulmonary disease, unspecified: Secondary | ICD-10-CM | POA: Insufficient documentation

## 2019-10-28 DIAGNOSIS — I10 Essential (primary) hypertension: Secondary | ICD-10-CM | POA: Insufficient documentation

## 2019-10-28 DIAGNOSIS — F419 Anxiety disorder, unspecified: Secondary | ICD-10-CM | POA: Insufficient documentation

## 2019-10-28 DIAGNOSIS — G709 Myoneural disorder, unspecified: Secondary | ICD-10-CM | POA: Insufficient documentation

## 2019-10-28 DIAGNOSIS — E785 Hyperlipidemia, unspecified: Secondary | ICD-10-CM | POA: Insufficient documentation

## 2019-10-28 DIAGNOSIS — K219 Gastro-esophageal reflux disease without esophagitis: Secondary | ICD-10-CM | POA: Insufficient documentation

## 2019-10-29 ENCOUNTER — Other Ambulatory Visit: Payer: Self-pay

## 2019-10-30 ENCOUNTER — Encounter: Payer: Self-pay | Admitting: Cardiology

## 2019-10-30 ENCOUNTER — Other Ambulatory Visit: Payer: Self-pay

## 2019-10-30 ENCOUNTER — Ambulatory Visit (INDEPENDENT_AMBULATORY_CARE_PROVIDER_SITE_OTHER): Payer: PRIVATE HEALTH INSURANCE

## 2019-10-30 ENCOUNTER — Ambulatory Visit (INDEPENDENT_AMBULATORY_CARE_PROVIDER_SITE_OTHER): Payer: PRIVATE HEALTH INSURANCE | Admitting: Cardiology

## 2019-10-30 VITALS — BP 110/76 | HR 70 | Ht 68.0 in | Wt 246.8 lb

## 2019-10-30 DIAGNOSIS — R42 Dizziness and giddiness: Secondary | ICD-10-CM

## 2019-10-30 DIAGNOSIS — I491 Atrial premature depolarization: Secondary | ICD-10-CM

## 2019-10-30 DIAGNOSIS — I10 Essential (primary) hypertension: Secondary | ICD-10-CM | POA: Diagnosis not present

## 2019-10-30 DIAGNOSIS — E782 Mixed hyperlipidemia: Secondary | ICD-10-CM | POA: Diagnosis not present

## 2019-10-30 DIAGNOSIS — R5383 Other fatigue: Secondary | ICD-10-CM

## 2019-10-30 DIAGNOSIS — E0800 Diabetes mellitus due to underlying condition with hyperosmolarity without nonketotic hyperglycemic-hyperosmolar coma (NKHHC): Secondary | ICD-10-CM | POA: Diagnosis not present

## 2019-10-30 NOTE — Patient Instructions (Signed)
Medication Instructions:  Your physician has recommended you make the following change in your medication:   Stop propranolol.  *If you need a refill on your cardiac medications before your next appointment, please call your pharmacy*   Lab Work: None ordered If you have labs (blood work) drawn today and your tests are completely normal, you will receive your results only by: Marland Kitchen MyChart Message (if you have MyChart) OR . A paper copy in the mail If you have any lab test that is abnormal or we need to change your treatment, we will call you to review the results.   Testing/Procedures:  WHY IS MY DOCTOR PRESCRIBING ZIO? The Zio system is proven and trusted by physicians to detect and diagnose irregular heart rhythms -- and has been prescribed to hundreds of thousands of patients.  The FDA has cleared the Zio system to monitor for many different kinds of irregular heart rhythms. In a study, physicians were able to reach a diagnosis 90% of the time with the Zio system1.  You can wear the Zio monitor -- a small, discreet, comfortable patch -- during your normal day-to-day activity, including while you sleep, shower, and exercise, while it records every single heartbeat for analysis.  1Barrett, P., et al. Comparison of 24 Hour Holter Monitoring Versus 14 Day Novel Adhesive Patch Electrocardiographic Monitoring. [place], 2014.  ZIO VS. HOLTER MONITORING The Zio monitor can be comfortably worn for up to 14 days. Holter monitors can be worn for 24 to 48 hours, limiting the time to record any irregular heart rhythms you may have. Zio is able to capture data for the 51% of patients who have their first symptom-triggered arrhythmia after 48 hours.1  LIVE WITHOUT RESTRICTIONS The Zio ambulatory cardiac monitor is a small, unobtrusive, and water-resistant patch--you might even forget you're wearing it. The Zio monitor records and stores every beat of your heart, whether you're  sleeping, working out, or showering. Wear the monitor for 14 days, remove on 11/13/19.  Your physician has recommended that you have a sleep study. This test records several body functions during sleep, including: brain activity, eye movement, oxygen and carbon dioxide blood levels, heart rate and rhythm, breathing rate and rhythm, the flow of air through your mouth and nose, snoring, body muscle movements, and chest and belly movement.    Follow-Up: At [facility], you and your health needs are our priority.  As part of our continuing mission to provide you with exceptional heart care, we have created designated Provider Care Teams.  These Care Teams include your primary Cardiologist (physician) and Advanced Practice Providers (APPs -  Physician Assistants and Nurse Practitioners) who all work together to provide you with the care you need, when you need it.  We recommend signing up for the patient portal called "MyChart".  Sign up information is provided on this After Visit Summary.  MyChart is used to connect with patients for Virtual Visits (Telemedicine).  Patients are able to view lab/test results, encounter notes, upcoming appointments, etc.  Non-urgent messages can be sent to your provider as well.   To learn more about what you can do with MyChart, go to [email or web address].    Your next appointment:   6 week(s)  The format for your next appointment:   In Person  Provider:   Berniece Salines, DO   Other Instructions  Sleep Studies A sleep study (polysomnogram) is a series of tests done while you are sleeping. A sleep study records your brain  waves, heart rate, breathing rate, oxygen level, and eye and leg movements. A sleep study helps your health care provider:  See how well you sleep.  Diagnose a sleep disorder.  Determine how severe your sleep disorder is.  Create a plan to treat your sleep disorder. Your health care provider may recommend a sleep study if  you:  Feel sleepy on most days.  Snore loudly while sleeping.  Have unusual behaviors while you sleep, such as walking.  Have brief periods in which you stop breathing during sleep (sleepapnea).  Fall asleep suddenly during the day (narcolepsy).  Have trouble falling asleep or staying asleep (insomnia).  Feel like you need to move your legs when trying to fall asleep (restless legs syndrome).  Move your legs by flexing and extending them regularly while asleep (periodic limb movement disorder).  Act out your dreams while you sleep (sleep behavior disorder).  Feel like you cannot move when you first wake up (sleep paralysis). What tests are part of a sleep study? Most sleep studies record the following during sleep:  Brain activity.  Eye movements.  Heart rate and rhythm.  Breathing rate and rhythm.  Blood-oxygen level.  Blood pressure.  Chest and belly movement as you breathe.  Arm and leg movements.  Snoring or other noises.  Body position. Where are sleep studies done? Sleep studies are done at sleep centers. A sleep center may be inside a hospital, office, or clinic. The room where you have the study may look like a hospital room or a hotel room. The health care providers doing the study may come in and out of the room during the study. Most of the time, they will be in another room monitoring your test as you sleep. How are sleep studies done? Most sleep studies are done during a normal period of time for a full night of sleep. You will arrive at the study center in the evening and go home in the morning. Before the test  Bring your pajamas and toothbrush with you to the sleep study.  Do not have caffeine on the day of your sleep study.  Do not drink alcohol on the day of your sleep study.  Your health care provider will let you know if you should stop taking any of your regular medicines before the test. During the test      Round, sticky patches  with sensors attached to recording wires (electrodes) are placed on your scalp, face, chest, and limbs.  Wires from all the electrodes and sensors run from your bed to a computer. The wires can be taken off and put back on if you need to get out of bed to go to the bathroom.  A sensor is placed over your nose to measure airflow.  A finger clip is put on your finger or ear to measure your blood oxygen level (pulse oximetry).  A belt is placed around your belly and a belt is placed around your chest to measure breathing movements.  If you have signs of the sleep disorder called sleep apnea during your test, you may get a treatment mask to wear for the second half of the night. ? The mask provides positive airway pressure (PAP) to help you breathe better during sleep. This may greatly improve your sleep apnea. ? You will then have all tests done again with the mask in place to see if your measurements and recordings change. After the test  A medical doctor who specializes in sleep  will evaluate the results of your sleep study and share them with you and your primary health care provider.  Based on your results, your medical history, and a physical exam, you may be diagnosed with a sleep disorder, such as: ? Sleep apnea. ? Restless legs syndrome. ? Sleep-related behavior disorder. ? Sleep-related movement disorders. ? Sleep-related seizure disorders.  Your health care team will help determine your treatment options based on your diagnosis. This may include: ? Improving your sleep habits (sleep hygiene). ? Wearing a continuous positive airway pressure (CPAP) or bi-level positive airway pressure (BPAP) mask. ? Wearing an oral device at night to improve breathing and reduce snoring. ? Taking medicines. Follow these instructions at home:  Take over-the-counter and prescription medicines only as told by your health care provider.  If you are instructed to use a CPAP or BPAP mask, make sure  you use it nightly as directed.  Make any lifestyle changes that your health care provider recommends.  If you were given a device to open your airway while you sleep, use it only as told by your health care provider.  Do not use any tobacco products, such as cigarettes, chewing tobacco, and e-cigarettes. If you need help quitting, ask your health care provider.  Keep all follow-up visits as told by your health care provider. This is important. Summary  A sleep study (polysomnogram) is a series of tests done while you are sleeping. It shows how well you sleep.  Most sleep studies are done over one full night of sleep. You will arrive at the study center in the evening and go home in the morning.  If you have signs of the sleep disorder called sleep apnea during your test, you may get a treatment mask to wear for the second half of the night.  A medical doctor who specializes in sleep will evaluate the results of your sleep study and share them with your primary health care provider. This information is not intended to replace advice given to you by your health care provider. Make sure you discuss any questions you have with your health care provider. Document Revised: 06/12/2018 Document Reviewed: 01/23/2017 [employer] Patient Education  [place].

## 2019-10-30 NOTE — Progress Notes (Signed)
Cardiology Office Note:    Date:  10/30/2019   ID:  Debra Wolf, DOB 09-04-1970, MRN [id number]  PCP:  [facility]  Cardiologist:  Berniece Salines, DO  Electrophysiologist:  None   Referring MD: Physicians, Debra Wolf am doing a little better, but my heart rate still low.  Is dropping in the 40s at home.  History of Present Illness:    Debra Wolf is a 49 y.o. female with a hx of diabetes mellitus, hypertension, hyperlipidemia and ectopic atrial rhythm.  The patient did have some significant palpitations in the beginning of the year we will place a monitor on the patient.  Given his significant palpitation and some ectopic atrial rhythm we started the patient on Cardizem but we had to stop this medication due to its side effects on the patient.  We did not start the patient on Toprol-XL but she gets significant dizzy on this medication.  She was on carvedilol but this medication also she complained that she had some problems.  She had intermittent chest pain I did send the patient for coronary CTA which show mild coronary artery disease with 0 calcium score.  There is evidence of mild dilatation of her main pulmonary artery at 30 mm.  In the meantime she tells me that she was having significant lightheadedness and dizziness as well as some chest pain so she was taken to [facility] by her husband.  The patient was admitted during that time she had a nuclear stress test.  She also had a chest CTA which showed no pulmonary embolism or dissection.  She does tell me during her hospital stay during the nighttime she was waking up 3 times in the middle of the night due to significant drop in heart rate. She also tells me that recently at home during the daytime she notes her heart rate is usually in the 50s and 40s.  Given the fact that she feels tired lightheaded and dizzy she came to see me today.  Past Medical History:  Diagnosis Date  . Anxiety   . Asthma   . Benign  essential hypertension   . Chest pain 07/18/2017   Last Assessment & Plan:  Formatting of this note might be different from the original. She describes a midsternal chest heaviness times 60-90 minutes that was relieved by nitroglycerin.  Troponin is negative and EKG does not suggest acute ischemic changes.  Echo from April of this year showed normal cardiac size and function without valvular abnormalities.  CTA performed in August of this year sug  . Chronic pain 04/24/2014  . COPD (chronic obstructive pulmonary disease) ([place])   . COPD exacerbation ([place]) 07/08/2017   Last Assessment & Plan:  Formatting of this note might be different from the original. Takes Symbicort.  Needs to quit smoking.  Continue management per primary team.  . Depression 10/26/2019  . Diabetes ([place]) 02/22/2017  . Diabetes mellitus ([place]) 02/22/2017   Formatting of this note might be different from the original. diet controlled Formatting of this note might be different from the original. diet controlled  . DOE (dyspnea on exertion) 02/28/2017  . Dyslipidemia   . Dyspnea and respiratory abnormality 05/30/2012  . Ectopic atrial rhythm   . Esophageal reflux 05/30/2012  . Essential hypertension 05/30/2012   Last Assessment & Plan:  Formatting of this note might be different from the original. Stable; continue management per primary team  . GERD (gastroesophageal reflux disease)   .  Hypophosphatemia 10/26/2019  . Hypothyroid   . Hypoxia 07/08/2017  . Leukocytosis 10/26/2019  . Mass of ovary   . Mixed hyperlipidemia 03/03/2019  . Morbid obesity ([place]) 12/28/2016  . Neck pain 04/27/2014  . Nervousness 05/30/2012  . Neuromuscular disorder ([facility])    esentrial tremors  . Pain in joint involving lower leg 05/30/2012  . Pain medication agreement signed 07/06/2016   Overview:  Overview:  [facility] opiod agreement reviewed, signed and copy given to patient. Overview:  [facility] opiod agreement reviewed, signed and copy given to patient.  .  Palpitation 02/22/2017  . Polyneuropathy in diabetes ([place]) 05/30/2012  . Pre-operative cardiovascular examination 02/28/2017  . 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 Tobacco use disorder 03/15/2016  . Tremor, essential 11/03/2014    Past Surgical History:  Procedure Laterality Date  . CHOLECYSTECTOMY    . COLONOSCOPY  2016   Dr Orlena Sheldon  . ESOPHAGOGASTRODUODENOSCOPY  10/25/2015   Mild gastritis. Minimal hiatal hernia. Otherwise, normal EGD  . PARTIAL HYSTERECTOMY    . removal of deep brain stimulator  05/16/2016   Dr. Phylliss Blakes at [facility]  . TUBAL LIGATION    . UPPER GASTROINTESTINAL ENDOSCOPY  2004    Current Medications: Current Meds  Medication Sig  . albuterol (PROVENTIL HFA;VENTOLIN HFA) 108 (90 Base) MCG/ACT inhaler Inhale into the lungs in the morning and at bedtime.   Marland Kitchen aspirin EC 81 MG tablet Take 1 tablet (81 mg total) by mouth daily. Swallow whole.  . budesonide-formoterol (SYMBICORT) 160-4.5 MCG/ACT inhaler INHALE TWO PUFFS TWICE DAILY  . Coenzyme Q10 10 MG capsule Take 10 mg by mouth daily.  . cyclobenzaprine (FLEXERIL) 10 MG tablet Take 10 mg by mouth.  . fluticasone (FLONASE) 50 MCG/ACT nasal spray Use 2 sprays in each nostril once daily  . furosemide (LASIX) 40 MG tablet Take 1 tablet (40 mg total) by mouth daily.  Marland Kitchen gabapentin (NEURONTIN) 300 MG capsule Take 300 mg by mouth at bedtime.  . isosorbide mononitrate (IMDUR) 30 MG 24 hr tablet Take 30 mg by mouth daily.  Marland Kitchen levothyroxine (SYNTHROID) 137 MCG tablet Take 137 mcg by mouth daily.  . nitroGLYCERIN (NITROSTAT) 0.4 MG SL tablet DISSOLVE 1 TABLET UNDER THE TONGUE EVERY 5 MINUTES AS NEEDED FOR CHEST PAIN. DO NOT EXCEED A TOTAL OF 3 DOSES IN 15 MINUTES.  . pantoprazole (PROTONIX) 40 MG tablet Take 1 tablet (40 mg total) by mouth 2 (two) times daily.  . potassium chloride SA (KLOR-CON) 20 MEQ tablet Take 1 tablet (20 mEq total) by mouth daily. (Patient taking differently: Take 20 mEq by mouth as needed. )  . rosuvastatin  (CRESTOR) 10 MG tablet Take 1 tablet (10 mg total) by mouth daily.  . Vitamin D, Ergocalciferol, (DRISDOL) 50000 units CAPS capsule TAKE ONE CAPSULE BY MOUTH ONCE A WEEK  . [DISCONTINUED] propranolol (INDERAL) 10 MG tablet Take 1 tablet (10 mg total) by mouth daily.     Allergies:   Penicillins, Vancomycin, and Cephalexin   Social History   Socioeconomic History  . Marital status: Married    Spouse name: Not on file  . Number of children: 2  . Years of education: Not on file  . Highest education level: Not on file  Occupational History  . Occupation: disabled  Tobacco Use  . Smoking status: Current Every Day Smoker    Packs/day: 1.00  . Smokeless tobacco: Never Used  Vaping Use  . Vaping Use: Never used  Substance and Sexual Activity  . Alcohol use:  Not Currently  . Drug use: No  . Sexual activity: Not on file  Other Topics Concern  . Not on file  Social History Narrative  . Not on file   Social Determinants of Health   Financial Resource Strain:   . Difficulty of Paying Living Expenses: Not on file  Food Insecurity:   . Worried About Charity fundraiser in the Last Year: Not on file  . Ran Out of Food in the Last Year: Not on file  Transportation Needs:   . Lack of Transportation (Medical): Not on file  . Lack of Transportation (Non-Medical): Not on file  Physical Activity:   . Days of Exercise per Week: Not on file  . Minutes of Exercise per Session: Not on file  Stress:   . Feeling of Stress : Not on file  Social Connections:   . Frequency of Communication with Friends and Family: Not on file  . Frequency of Social Gatherings with Friends and Family: Not on file  . Attends Religious Services: Not on file  . Active Member of Clubs or Organizations: Not on file  . Attends Archivist Meetings: Not on file  . Marital Status: Not on file     Family History: The patient's family history includes Bladder Cancer in her mother; Cirrhosis in her maternal  uncle; Congestive Heart Failure in her father and mother; Lung cancer in her father; Ovarian cancer in her sister. There is no history of Colon cancer, Liver cancer, Colon polyps, Esophageal cancer, Stomach cancer, or Rectal cancer.  ROS:   Review of Systems  Constitution: Negative for decreased appetite, fever and weight gain.  HENT: Negative for congestion, ear discharge, hoarse voice and sore throat.   Eyes: Negative for discharge, redness, vision loss in right eye and visual halos.  Cardiovascular: Negative for chest pain, dyspnea on exertion, leg swelling, orthopnea and palpitations.  Respiratory: Negative for cough, hemoptysis, shortness of breath and snoring.   Endocrine: Negative for heat intolerance and polyphagia.  Hematologic/Lymphatic: Negative for bleeding problem. Does not bruise/bleed easily.  Skin: Negative for flushing, nail changes, rash and suspicious lesions.  Musculoskeletal: Negative for arthritis, joint pain, muscle cramps, myalgias, neck pain and stiffness.  Gastrointestinal: Negative for abdominal pain, bowel incontinence, diarrhea and excessive appetite.  Genitourinary: Negative for decreased libido, genital sores and incomplete emptying.  Neurological: Negative for brief paralysis, focal weakness, headaches and loss of balance.  Psychiatric/Behavioral: Negative for altered mental status, depression and suicidal ideas.  Allergic/Immunologic: Negative for HIV exposure and persistent infections.    EKGs/Labs/Other Studies Reviewed:    The following studies were reviewed today:   EKG: None today.   Transthoracic echocardiogram IMPRESSIONS  1. Left ventricular ejection fraction, by estimation, is 60 to 65%. The  left ventricle has normal function. The left ventricle has no regional  wall motion abnormalities. Left ventricular diastolic parameters were  normal.  2. Right ventricular systolic function is normal. The right ventricular  size is normal.  3. Left  atrial size was mildly dilated.  4. The mitral valve is normal in structure. No evidence of mitral valve  regurgitation. No evidence of mitral stenosis.  5. The aortic valve is normal in structure. Aortic valve regurgitation is  not visualized. No aortic stenosis is present.  6. The inferior vena cava is normal in size with greater than 50% respiratory variability, suggesting right atrial pressure of   Cardiac CT IMPRESSION: 1. Mild CAD in mid RCA, CADRADS = 2.  2.  Coronary calcium score of 0.  3. Normal coronary origin with right dominance.  4. Mild dilation of main pulmonary artery, 30 mm.  Recent Labs: 07/21/2019: Hemoglobin 12.4; Platelets 248; TSH 0.511 08/28/2019: BUN 10; Creatinine, Ser 0.93; Magnesium 1.9; Potassium 4.2; Sodium 140  Recent Lipid Panel No results found for: CHOL, TRIG, HDL, CHOLHDL, VLDL, LDLCALC, LDLDIRECT  Physical Exam:    VS:  BP 110/76   Pulse 70   Ht 5\' 8"  (1.727 m)   Wt 246 lb 12.8 oz (111.9 kg)   LMP  (LMP Unknown) Comment: partial hysterectomy  SpO2 98%   BMI 37.53 kg/m     Wt Readings from Last 3 Encounters:  10/30/19 246 lb 12.8 oz (111.9 kg)  08/28/19 251 lb (113.9 kg)  07/21/19 252 lb 12.8 oz (114.7 kg)     GEN: Well nourished, well developed in no acute distress HEENT: Normal NECK: No JVD; No carotid bruits LYMPHATICS: No lymphadenopathy CARDIAC: S1S2 noted,RRR, no murmurs, rubs, gallops RESPIRATORY:  Clear to auscultation without rales, wheezing or rhonchi  ABDOMEN: Soft, non-tender, non-distended, +bowel sounds, no guarding. EXTREMITIES: No edema, No cyanosis, no clubbing MUSCULOSKELETAL:  No deformity  SKIN: Warm and dry NEUROLOGIC:  Alert and oriented x 3, non-focal PSYCHIATRIC:  Normal affect, good insight  ASSESSMENT:    1. Essential hypertension   2. Ectopic atrial rhythm   3. Diabetes mellitus due to underlying condition with hyperosmolarity without coma, without long-term current use of insulin ([place])   4.  Mixed hyperlipidemia   5. Fatigue, unspecified type   6. Dizziness    PLAN:     I am concerned about her daytime heart rate and her symptoms of fatigue and dizziness place a monitor and the patient is real-life to understand this.  In regards to her heart rate dropping admitted at night and suspect she has sleep apnea because daytime somnolence also is the issue in this patient so sleep study is appropriate at this time. I do not believe she needs any other work-up as she has just had a coronary CTA which did show a calcium score of 0 with very mild RCA plaque.  Thankfully she has no shortness of breath we will continue to monitor the patient.  She does have mild pulmonary artery dilatation unfortunately her echocardiogram does not have her measured pulmonary artery systolic pressure due to her TR jet not being available.  We will continue to monitor as if she develops any shortness of breath with have the patient do right heart catheter understand the right-sided heart pressures.  Plan for now I think repeating her zio monitor to get a good understanding for heart rate discretions as well as well as sleep apnea is paramount.  The patient is in agreement with the above plan. The patient left the office in stable condition.  The patient will follow up in 6 weeks or sooner if needed.   Medication Adjustments/Labs and Tests Ordered: Current medicines are reviewed at length with the patient today.  Concerns regarding medicines are outlined above.  Orders Placed This Encounter  Procedures  . Ambulatory referral to Sleep Studies  . LONG TERM MONITOR-LIVE TELEMETRY (3-14 DAYS)   No orders of the defined types were placed in this encounter.   Patient Instructions  Medication Instructions:  Your physician has recommended you make the following change in your medication:   Stop propranolol.  *If you need a refill on your cardiac medications before your next appointment, please call your  pharmacy*  Lab Work: None ordered If you have labs (blood work) drawn today and your tests are completely normal, you will receive your results only by: Marland Kitchen MyChart Message (if you have MyChart) OR . A paper copy in the mail If you have any lab test that is abnormal or we need to change your treatment, we will call you to review the results.   Testing/Procedures:  WHY IS MY DOCTOR PRESCRIBING ZIO? The Zio system is proven and trusted by physicians to detect and diagnose irregular heart rhythms -- and has been prescribed to hundreds of thousands of patients.  The FDA has cleared the Zio system to monitor for many different kinds of irregular heart rhythms. In a study, physicians were able to reach a diagnosis 90% of the time with the Zio system1.  You can wear the Zio monitor -- a small, discreet, comfortable patch -- during your normal day-to-day activity, including while you sleep, shower, and exercise, while it records every single heartbeat for analysis.  1Barrett, P., et al. Comparison of 24 Hour Holter Monitoring Versus 14 Day Novel Adhesive Patch Electrocardiographic Monitoring. [place], 2014.  ZIO VS. HOLTER MONITORING The Zio monitor can be comfortably worn for up to 14 days. Holter monitors can be worn for 24 to 48 hours, limiting the time to record any irregular heart rhythms you may have. Zio is able to capture data for the 51% of patients who have their first symptom-triggered arrhythmia after 48 hours.1  LIVE WITHOUT RESTRICTIONS The Zio ambulatory cardiac monitor is a small, unobtrusive, and water-resistant patch--you might even forget you're wearing it. The Zio monitor records and stores every beat of your heart, whether you're sleeping, working out, or showering. Wear the monitor for 14 days, remove on 11/13/19.  Your physician has recommended that you have a sleep study. This test records several body functions during sleep, including: brain activity,  eye movement, oxygen and carbon dioxide blood levels, heart rate and rhythm, breathing rate and rhythm, the flow of air through your mouth and nose, snoring, body muscle movements, and chest and belly movement.    Follow-Up: At [facility], you and your health needs are our priority.  As part of our continuing mission to provide you with exceptional heart care, we have created designated Provider Care Teams.  These Care Teams include your primary Cardiologist (physician) and Advanced Practice Providers (APPs -  Physician Assistants and Nurse Practitioners) who all work together to provide you with the care you need, when you need it.  We recommend signing up for the patient portal called "MyChart".  Sign up information is provided on this After Visit Summary.  MyChart is used to connect with patients for Virtual Visits (Telemedicine).  Patients are able to view lab/test results, encounter notes, upcoming appointments, etc.  Non-urgent messages can be sent to your provider as well.   To learn more about what you can do with MyChart, go to [email or web address].    Your next appointment:   6 week(s)  The format for your next appointment:   In Person  Provider:   Berniece Salines, DO   Other Instructions  Sleep Studies A sleep study (polysomnogram) is a series of tests done while you are sleeping. A sleep study records your brain waves, heart rate, breathing rate, oxygen level, and eye and leg movements. A sleep study helps your health care provider:  See how well you sleep.  Diagnose a sleep disorder.  Determine how severe your sleep disorder is.  Create a plan to treat your sleep disorder. Your health care provider may recommend a sleep study if you:  Feel sleepy on most days.  Snore loudly while sleeping.  Have unusual behaviors while you sleep, such as walking.  Have brief periods in which you stop breathing during sleep (sleepapnea).  Fall asleep suddenly during the day  (narcolepsy).  Have trouble falling asleep or staying asleep (insomnia).  Feel like you need to move your legs when trying to fall asleep (restless legs syndrome).  Move your legs by flexing and extending them regularly while asleep (periodic limb movement disorder).  Act out your dreams while you sleep (sleep behavior disorder).  Feel like you cannot move when you first wake up (sleep paralysis). What tests are part of a sleep study? Most sleep studies record the following during sleep:  Brain activity.  Eye movements.  Heart rate and rhythm.  Breathing rate and rhythm.  Blood-oxygen level.  Blood pressure.  Chest and belly movement as you breathe.  Arm and leg movements.  Snoring or other noises.  Body position. Where are sleep studies done? Sleep studies are done at sleep centers. A sleep center may be inside a hospital, office, or clinic. The room where you have the study may look like a hospital room or a hotel room. The health care providers doing the study may come in and out of the room during the study. Most of the time, they will be in another room monitoring your test as you sleep. How are sleep studies done? Most sleep studies are done during a normal period of time for a full night of sleep. You will arrive at the study center in the evening and go home in the morning. Before the test  Bring your pajamas and toothbrush with you to the sleep study.  Do not have caffeine on the day of your sleep study.  Do not drink alcohol on the day of your sleep study.  Your health care provider will let you know if you should stop taking any of your regular medicines before the test. During the test      Round, sticky patches with sensors attached to recording wires (electrodes) are placed on your scalp, face, chest, and limbs.  Wires from all the electrodes and sensors run from your bed to a computer. The wires can be taken off and put back on if you need to get  out of bed to go to the bathroom.  A sensor is placed over your nose to measure airflow.  A finger clip is put on your finger or ear to measure your blood oxygen level (pulse oximetry).  A belt is placed around your belly and a belt is placed around your chest to measure breathing movements.  If you have signs of the sleep disorder called sleep apnea during your test, you may get a treatment mask to wear for the second half of the night. ? The mask provides positive airway pressure (PAP) to help you breathe better during sleep. This may greatly improve your sleep apnea. ? You will then have all tests done again with the mask in place to see if your measurements and recordings change. After the test  A medical doctor who specializes in sleep will evaluate the results of your sleep study and share them with you and your primary health care provider.  Based on your results, your medical history, and a physical exam, you may be diagnosed with a sleep disorder, such  as: ? Sleep apnea. ? Restless legs syndrome. ? Sleep-related behavior disorder. ? Sleep-related movement disorders. ? Sleep-related seizure disorders.  Your health care team will help determine your treatment options based on your diagnosis. This may include: ? Improving your sleep habits (sleep hygiene). ? Wearing a continuous positive airway pressure (CPAP) or bi-level positive airway pressure (BPAP) mask. ? Wearing an oral device at night to improve breathing and reduce snoring. ? Taking medicines. Follow these instructions at home:  Take over-the-counter and prescription medicines only as told by your health care provider.  If you are instructed to use a CPAP or BPAP mask, make sure you use it nightly as directed.  Make any lifestyle changes that your health care provider recommends.  If you were given a device to open your airway while you sleep, use it only as told by your health care provider.  Do not use any  tobacco products, such as cigarettes, chewing tobacco, and e-cigarettes. If you need help quitting, ask your health care provider.  Keep all follow-up visits as told by your health care provider. This is important. Summary  A sleep study (polysomnogram) is a series of tests done while you are sleeping. It shows how well you sleep.  Most sleep studies are done over one full night of sleep. You will arrive at the study center in the evening and go home in the morning.  If you have signs of the sleep disorder called sleep apnea during your test, you may get a treatment mask to wear for the second half of the night.  A medical doctor who specializes in sleep will evaluate the results of your sleep study and share them with your primary health care provider. This information is not intended to replace advice given to you by your health care provider. Make sure you discuss any questions you have with your health care provider. Document Revised: 06/12/2018 Document Reviewed: 01/23/2017 Elsevier Patient Education  [place].     Adopting a Healthy Lifestyle.  Know what a healthy weight is for you (roughly BMI <25) and aim to maintain this   Aim for 7+ servings of fruits and vegetables daily   65-80+ fluid ounces of water or unsweet tea for healthy kidneys   Limit to max 1 drink of alcohol per day; avoid smoking/tobacco   Limit animal fats in diet for cholesterol and heart health - choose grass fed whenever available   Avoid highly processed foods, and foods high in saturated/trans fats   Aim for low stress - take time to unwind and care for your mental health   Aim for 150 min of moderate intensity exercise weekly for heart health, and weights twice weekly for bone health   Aim for 7-9 hours of sleep daily   When it comes to diets, agreement about the perfect plan isnt easy to find, even among the experts. Experts at the [employer] developed an idea known  as the Healthy Eating Plate. Just imagine a plate divided into logical, healthy portions.   The emphasis is on diet quality:   Load up on vegetables and fruits - one-half of your plate: Aim for color and variety, and remember that potatoes dont count.   Go for whole grains - one-quarter of your plate: Whole wheat, barley, wheat berries, quinoa, oats, brown rice, and foods made with them. If you want pasta, go with whole wheat pasta.   Protein power - one-quarter of your plate: Fish, chicken,  beans, and nuts are all healthy, versatile protein sources. Limit red meat.   The diet, however, does go beyond the plate, offering a few other suggestions.   Use healthy plant oils, such as olive, canola, soy, corn, sunflower and peanut. Check the labels, and avoid partially hydrogenated oil, which have unhealthy trans fats.   If youre thirsty, drink water. Coffee and tea are good in moderation, but skip sugary drinks and limit milk and dairy products to one or two daily servings.   The type of carbohydrate in the diet is more important than the amount. Some sources of carbohydrates, such as vegetables, fruits, whole grains, and beans-are healthier than others.   Finally, stay active  Signed, Berniece Salines, DO  10/30/2019 9:40 PM     [facility]

## 2019-11-21 ENCOUNTER — Ambulatory Visit: Payer: PRIVATE HEALTH INSURANCE | Admitting: Cardiology

## 2019-11-24 ENCOUNTER — Ambulatory Visit: Payer: PRIVATE HEALTH INSURANCE | Admitting: Gastroenterology

## 2019-11-24 ENCOUNTER — Ambulatory Visit: Payer: PRIVATE HEALTH INSURANCE | Admitting: Cardiology

## 2019-11-24 ENCOUNTER — Telehealth: Payer: Self-pay

## 2019-11-24 MED ORDER — METOPROLOL SUCCINATE ER 25 MG PO TB24: 12.5000 mg | ORAL_TABLET | Freq: Every day | ORAL | 3 refills | Status: DC

## 2019-11-24 NOTE — Telephone Encounter (Signed)
Spoke with patient regarding results and recommendation.  Patient verbalizes understanding and is agreeable to plan of care. Advised patient to call back with any issues or concerns.  

## 2019-11-24 NOTE — Telephone Encounter (Signed)
-----   Message from Berniece Salines, DO sent at 11/24/2019  1:57 PM EST ----- Your monitor show that you had many episodes of paroxysmal supraventricular tachycardia.  I would like to start you on low-dose beta-blocker Toprol-XL 12.5 mg daily to see if this is going to help with the palpitations.

## 2019-12-11 ENCOUNTER — Other Ambulatory Visit: Payer: Self-pay

## 2019-12-15 ENCOUNTER — Ambulatory Visit: Payer: PRIVATE HEALTH INSURANCE | Admitting: Cardiology

## 2019-12-15 NOTE — Progress Notes (Deleted)
Cardiology Office Note:    Date:  12/15/2019   ID:  Debra Wolf, DOB October 25, 1970, MRN [id number]  PCP:  [facility]  Cardiologist:  Berniece Salines, DO  Electrophysiologist:  None   Referring MD: Phys[facility], Cheryll Dessert*   No chief complaint on file. ***  History of Present Illness:    Debra Wolf is a 49 y.o. female with a hx of diabetes mellitus, hypertension, hyperlipidemia and ectopic atrial rhythm.  The patient did have some significant palpitations in the beginning of the year we will place a monitor on the patient.  Given his significant palpitation and some ectopic atrial rhythm we started the patient on Cardizem but we had to stop this medication due to its side effects on the patient.    We did not start the patient on Toprol-XL but she gets significant dizzy on this medication.  She was on carvedilol but this medication also she complained that she had some problems.  She had intermittent chest pain I did send the patient for coronary CTA which show mild coronary artery disease with 0 calcium score.  There is evidence of mild dilatation of her main pulmonary artery at 30 mm.    In the meantime she was taken to [facility] by her husband for chest pain and shortness of breath.  The patient was admitted had a nuclear stress test.  She also had a chest CTA which showed no pulmonary embolism or dissection.  Of note during her hospital stay she was advised on low heart rate while sleeping.   I saw the patient on 10/30/19 and recommended she wear a zio monitor. A sleep study was also recommended.   In there interim her monitor showed 51 episodes of paroxysmal atrial tachycardia and recommended we try beta blockers again.   Past Medical History:  Diagnosis Date  . Anxiety   . Asthma   . Benign essential hypertension   . Chest pain 07/18/2017   Last Assessment & Plan:  Formatting of this note might be different from the original. She describes a midsternal chest  heaviness times 60-90 minutes that was relieved by nitroglycerin.  Troponin is negative and EKG does not suggest acute ischemic changes.  Echo from April of this year showed normal cardiac size and function without valvular abnormalities.  CTA performed in August of this year sug  . Chronic pain 04/24/2014  . COPD (chronic obstructive pulmonary disease) ([place])   . COPD exacerbation ([place]) 07/08/2017   Last Assessment & Plan:  Formatting of this note might be different from the original. Takes Symbicort.  Needs to quit smoking.  Continue management per primary team.  . Depression 10/26/2019  . Diabetes ([place]) 02/22/2017  . Diabetes mellitus ([place]) 02/22/2017   Formatting of this note might be different from the original. diet controlled Formatting of this note might be different from the original. diet controlled  . DOE (dyspnea on exertion) 02/28/2017  . Dyslipidemia   . Dyspnea and respiratory abnormality 05/30/2012  . Ectopic atrial rhythm   . Esophageal reflux 05/30/2012  . Essential hypertension 05/30/2012   Last Assessment & Plan:  Formatting of this note might be different from the original. Stable; continue management per primary team  . GERD (gastroesophageal reflux disease)   . Hypophosphatemia 10/26/2019  . Hypothyroid   . Hypoxia 07/08/2017  . Leukocytosis 10/26/2019  . Mass of ovary   . Mixed hyperlipidemia 03/03/2019  . Morbid obesity ([place]) 12/28/2016  . Neck pain 04/27/2014  .  Nervousness 05/30/2012  . Neuromuscular disorder ([facility])    esentrial tremors  . Pain in joint involving lower leg 05/30/2012  . Pain medication agreement signed 07/06/2016   Overview:  Overview:  [facility] opiod agreement reviewed, signed and copy given to patient. Overview:  [facility] opiod agreement reviewed, signed and copy given to patient.  . Palpitation 02/22/2017  . Polyneuropathy in diabetes ([place]) 05/30/2012  . Pre-operative cardiovascular examination 02/28/2017  . 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 Tobacco use  disorder 03/15/2016  . Tremor, essential 11/03/2014    Past Surgical History:  Procedure Laterality Date  . CHOLECYSTECTOMY    . COLONOSCOPY  2016   Dr Orlena Sheldon  . ESOPHAGOGASTRODUODENOSCOPY  10/25/2015   Mild gastritis. Minimal hiatal hernia. Otherwise, normal EGD  . PARTIAL HYSTERECTOMY    . removal of deep brain stimulator  05/16/2016   Dr. Phylliss [facility]akes [facility]  . TUBAL LIGATION    . UPPER GASTROINTESTINAL ENDOSCOPY  2004    Current Medications: No outpatient medications have been marked as taking for the 12/15/19 encounter (Appointment) with Berniece Salines, DO.     Allergies:   Penicillins, Vancomycin, and Cephalexin   Social History   Socioeconomic History  . Marital status: Married    Spouse name: Not on file  . Number of children: 2  . Years of education: Not on file  . Highest education level: Not on file  Occupational History  . Occupation: disabled  Tobacco Use  . Smoking status: Current Every Day Smoker    Packs/day: 1.00  . Smokeless tobacco: Never Used  Vaping Use  . Vaping Use: Never used  Substance and Sexual Activity  . Alcohol use: Not Currently  . Drug use: No  . Sexual activity: Not on file  Other Topics Concern  . Not on file  Social History Narrative  . Not on file   Social Determinants of Health   Financial Resource Strain:   . Difficulty of Paying Living Expenses: Not on file  Food Insecurity:   . Worried About Charity fundraiser in the Last Year: Not on file  . Ran Out of Food in the Last Year: Not on file  Transportation Needs:   . Lack of Transportation (Medical): Not on file  . Lack of Transportation (Non-Medical): Not on file  Physical Activity:   . Days of Exercise per Week: Not on file  . Minutes of Exercise per Session: Not on file  Stress:   . Feeling of Stress : Not on file  Social Connections:   . Frequency of Communication with Friends and Family: Not on file  . Frequency of Social Gatherings with Friends and Family: Not on  file  . Attends Religious Services: Not on file  . Active Member of Clubs or Organizations: Not on file  . Attends Archivist Meetings: Not on file  . Marital Status: Not on file     Family History: The patient's family history includes Bladder Cancer in her mother; Cirrhosis in her maternal uncle; Congestive Heart Failure in her father and mother; Lung cancer in her father; Ovarian cancer in her sister. There is no history of Colon cancer, Liver cancer, Colon polyps, Esophageal cancer, Stomach cancer, or Rectal cancer.  ROS:   Review of Systems  Constitution: Negative for decreased appetite, fever and weight gain.  HENT: Negative for congestion, ear discharge, hoarse voice and sore throat.   Eyes: Negative for discharge, redness, vision loss in right eye and visual halos.  Cardiovascular: Negative for chest pain, dyspnea on exertion, leg swelling, orthopnea and palpitations.  Respiratory: Negative for cough, hemoptysis, shortness of breath and snoring.   Endocrine: Negative for heat intolerance and polyphagia.  Hematologic/Lymphatic: Negative for bleeding problem. Does not bruise/bleed easily.  Skin: Negative for flushing, nail changes, rash and suspicious lesions.  Musculoskeletal: Negative for arthritis, joint pain, muscle cramps, myalgias, neck pain and stiffness.  Gastrointestinal: Negative for abdominal pain, bowel incontinence, diarrhea and excessive appetite.  Genitourinary: Negative for decreased libido, genital sores and incomplete emptying.  Neurological: Negative for brief paralysis, focal weakness, headaches and loss of balance.  Psychiatric/Behavioral: Negative for altered mental status, depression and suicidal ideas.  Allergic/Immunologic: Negative for HIV exposure and persistent infections.    EKGs/Labs/Other Studies Reviewed:    The following studies were reviewed today:   EKG:  The ekg ordered today demonstrates   Zio monitor  Indication: Dizziness and  giddiness. The minimum heart rate was 41bpm, maximum heart rate was138 bpm, and average heart rate was 62 bpm. Predominant underlying rhythm was Sinus Rhythm. Ectopic Atrial Rhythm was present.   51 Supraventricular Tachycardia runs occurred, the run with the fastest interval lasting 4 beats with a max rate of 138 bpm, the longest lasting 1 minute 16 seconds with an average rate of 93 bpm.   Premature atrial complexes were rare less than 1%. Premature Ventricular complexes rare less than 1%.  No ventricular tachycardia, no pauses, No AV block, and no atrial fibrillation present. 6 patient triggered events associated with premature atrial complex.  6 diary events to which is associated with ectopic atrial rhythm and the remaining is associated with sinus rhythm.  Conclusion: This study is remarkable for paroxysmal supraventricular tachycardia.   Transthoracic echocardiogram IMPRESSIONS  1. Left ventricular ejection fraction, by estimation, is 60 to 65%. The left ventricle has normal function. The left ventricle has no regional wall motion abnormalities. Left ventricular diastolic parameters were normal.  2. Right ventricular systolic function is normal. The right ventricular size is normal.  3. Left atrial size was mildly dilated.  4. The mitral valve is normal in structure. No evidence of mitral valve regurgitation. No evidence of mitral stenosis.  5. The aortic valve is normal in structure. Aortic valve regurgitation is not visualized. No aortic stenosis is present.  6. The inferior vena cava is normal in size with greater than 50% respiratory variability, suggesting right atrial pressure of   Cardiac CT IMPRESSION: 1. Mild CAD in mid RCA, CADRADS = 2.  2. Coronary calcium score of 0.  3. Normal coronary origin with right dominance.  4. Mild dilation of main pulmonary artery, 30 mm.   Recent Labs: 07/21/2019: Hemoglobin 12.4; Platelets 248; TSH 0.511 08/28/2019: BUN  10; Creatinine, Ser 0.93; Magnesium 1.9; Potassium 4.2; Sodium 140  Recent Lipid Panel No results found for: CHOL, TRIG, HDL, CHOLHDL, VLDL, LDLCALC, LDLDIRECT  Physical Exam:    VS:  LMP  (LMP Unknown) Comment: partial hysterectomy    Wt Readings from Last 3 Encounters:  10/30/19 246 lb 12.8 oz (111.9 kg)  08/28/19 251 lb (113.9 kg)  07/21/19 252 lb 12.8 oz (114.7 kg)     GEN: Well nourished, well developed in no acute distress HEENT: Normal NECK: No JVD; No carotid bruits LYMPHATICS: No lymphadenopathy CARDIAC: S1S2 noted,RRR, no murmurs, rubs, gallops RESPIRATORY:  Clear to auscultation without rales, wheezing or rhonchi  ABDOMEN: Soft, non-tender, non-distended, +bowel sounds, no guarding. EXTREMITIES: No edema, No cyanosis, no clubbing MUSCULOSKELETAL:  No deformity  SKIN: Warm and dry NEUROLOGIC:  Alert and oriented x 3, non-focal PSYCHIATRIC:  Normal affect, good insight  ASSESSMENT:    No diagnosis found. PLAN:     1.  The patient is in agreement with the above plan. The patient left the office in stable condition.  The patient will follow up in   Medication Adjustments/Labs and Tests Ordered: Current medicines are reviewed at length with the patient today.  Concerns regarding medicines are outlined above.  No orders of the defined types were placed in this encounter.  No orders of the defined types were placed in this encounter.   There are no Patient Instructions on file for this visit.   Adopting a Healthy Lifestyle.  Know what a healthy weight is for you (roughly BMI <25) and aim to maintain this   Aim for 7+ servings of fruits and vegetables daily   65-80+ fluid ounces of water or unsweet tea for healthy kidneys   Limit to max 1 drink of alcohol per day; avoid smoking/tobacco   Limit animal fats in diet for cholesterol and heart health - choose grass fed whenever available   Avoid highly processed foods, and foods high in saturated/trans fats    Aim for low stress - take time to unwind and care for your mental health   Aim for 150 min of moderate intensity exercise weekly for heart health, and weights twice weekly for bone health   Aim for 7-9 hours of sleep daily   When it comes to diets, agreement about the perfect plan isnt easy to find, even among the experts. Experts at the [employer] developed an idea known as the Healthy Eating Plate. Just imagine a plate divided into logical, healthy portions.   The emphasis is on diet quality:   Load up on vegetables and fruits - one-half of your plate: Aim for color and variety, and remember that potatoes dont count.   Go for whole grains - one-quarter of your plate: Whole wheat, barley, wheat berries, quinoa, oats, brown rice, and foods made with them. If you want pasta, go with whole wheat pasta.   Protein power - one-quarter of your plate: Fish, chicken, beans, and nuts are all healthy, versatile protein sources. Limit red meat.   The diet, however, does go beyond the plate, offering a few other suggestions.   Use healthy plant oils, such as olive, canola, soy, corn, sunflower and peanut. Check the labels, and avoid partially hydrogenated oil, which have unhealthy trans fats.   If youre thirsty, drink water. Coffee and tea are good in moderation, but skip sugary drinks and limit milk and dairy products to one or two daily servings.   The type of carbohydrate in the diet is more important than the amount. Some sources of carbohydrates, such as vegetables, fruits, whole grains, and beans-are healthier than others.   Finally, stay active  Signed, Berniece Salines, DO  12/15/2019 11:15 AM    [place]

## 2019-12-24 ENCOUNTER — Encounter: Payer: Self-pay | Admitting: Cardiology

## 2019-12-24 ENCOUNTER — Other Ambulatory Visit: Payer: Self-pay

## 2019-12-24 ENCOUNTER — Ambulatory Visit (INDEPENDENT_AMBULATORY_CARE_PROVIDER_SITE_OTHER): Payer: PRIVATE HEALTH INSURANCE | Admitting: Cardiology

## 2019-12-24 VITALS — BP 116/74 | HR 80 | Ht 68.0 in | Wt 252.8 lb

## 2019-12-24 DIAGNOSIS — E8881 Metabolic syndrome: Secondary | ICD-10-CM | POA: Diagnosis not present

## 2019-12-24 DIAGNOSIS — Z79899 Other long term (current) drug therapy: Secondary | ICD-10-CM

## 2019-12-24 DIAGNOSIS — I471 Supraventricular tachycardia: Secondary | ICD-10-CM | POA: Diagnosis not present

## 2019-12-24 MED ORDER — ROSUVASTATIN CALCIUM 5 MG PO TABS: 5.0000 mg | ORAL_TABLET | Freq: Every day | ORAL | 3 refills | Status: DC

## 2019-12-24 NOTE — Patient Instructions (Signed)
Medication Instructions:  Your physician has recommended you make the following change in your medication:  START: Crestor 5mg  daily  *If you need a refill on your cardiac medications before your next appointment, please call your pharmacy*   Lab Work: Your physician recommends that you return for lab work in:   BMET, Magnesium, vitamin D   If you have labs (blood work) drawn today and your tests are completely normal, you will receive your results only by: [facility] MyChart Message (if you have MyChart) OR . A paper copy in the mail If you have any lab test that is abnormal or we need to change your treatment, we will call you to review the results.   Testing/Procedures: NONE   Follow-Up: At [facility], you and your health needs are our priority.  As part of our continuing mission to provide you with exceptional heart care, we have created designated Provider Care Teams.  These Care Teams include your primary Cardiologist (physician) and Advanced Practice Providers (APPs -  Physician Assistants and Nurse Practitioners) who all work together to provide you with the care you need, when you need it.  We recommend signing up for the patient portal called "MyChart".  Sign up information is provided on this After Visit Summary.  MyChart is used to connect with patients for Virtual Visits (Telemedicine).  Patients are able to view lab/test results, encounter notes, upcoming appointments, etc.  Non-urgent messages can be sent to your provider as well.   To learn more about what you can do with MyChart, go to [email or web address].    Your next appointment:   6 month(s)  The format for your next appointment:   In Person  Provider:   Berniece Salines, DO   Other Instructions

## 2019-12-24 NOTE — Progress Notes (Signed)
Cardiology Office Note:    Date:  12/24/2019   ID:  Debra Wolf, DOB [birth year], MRN [id number]  PCP:  [facility]  Cardiologist:  Berniece Salines, DO  Electrophysiologist:  None   Referring MD: Phys[facility], [place] am doing well  History of Present Illness:    Debra Wolf is a 49 y.o. female with a hx of diabetes mellitus, hypertension, hyperlipidemia, ectopic atrial rhythm.  She had chest pain and we ordered a coronary CTA showed calcium score of 0 with very mild RCA soft plaque..  In the meantime she ended up at [facility] based on chest pain she had a nuclear stress test there which was normal.  At her follow-up visit from [place] I placed the patient Imdur to help with any microvascular disease that she was experiencing.  She reported that she had some dizziness and lightheadedness will place a monitor on the patient and started Toprol-XL.  She says that the Toprol-XL was not treating her very well he has not really been taking this medication.  She is here today for follow-up visit.  She reports that she has been doing well from a cardiovascular standpoint.  Past Medical History:  Diagnosis Date  . Anxiety   . Asthma   . Benign essential hypertension   . Chest pain 07/18/2017   Last Assessment & Plan:  Formatting of this note might be different from the original. She describes a midsternal chest heaviness times 60-90 minutes that was relieved by nitroglycerin.  Troponin is negative and EKG does not suggest acute ischemic changes.  Echo from April of this year showed normal cardiac size and function without valvular abnormalities.  CTA performed in August of this year sug  . Chronic pain 04/24/2014  . COPD (chronic obstructive pulmonary disease) ([place])   . COPD exacerbation ([place]) 07/08/2017   Last Assessment & Plan:  Formatting of this note might be different from the original. Takes Symbicort.  Needs to quit smoking.  Continue management per primary  team.  . Depression 10/26/2019  . Diabetes ([place]) 02/22/2017  . Diabetes mellitus ([place]) 02/22/2017   Formatting of this note might be different from the original. diet controlled Formatting of this note might be different from the original. diet controlled  . DOE (dyspnea on exertion) 02/28/2017  . Dyslipidemia   . Dyspnea and respiratory abnormality 05/30/2012  . Ectopic atrial rhythm   . Esophageal reflux 05/30/2012  . Essential hypertension 05/30/2012   Last Assessment & Plan:  Formatting of this note might be different from the original. Stable; continue management per primary team  . GERD (gastroesophageal reflux disease)   . Hypophosphatemia 10/26/2019  . Hypothyroid   . Hypoxia 07/08/2017  . Leukocytosis 10/26/2019  . Mass of ovary   . Mixed hyperlipidemia 03/03/2019  . Morbid obesity ([place]) 12/28/2016  . Neck pain 04/27/2014  . Nervousness 05/30/2012  . Neuromuscular disorder ([facility])    esentrial tremors  . Pain in joint involving lower leg 05/30/2012  . Pain medication agreement signed 07/06/2016   Overview:  Overview:  [facility] ANES opiod agreement reviewed, signed and copy given to patient. Overview:  [facility] opiod agreement reviewed, signed and copy given to patient.  . Palpitation 02/22/2017  . Polyneuropathy in diabetes ([place]) 05/30/2012  . Pre-operative cardiovascular examination 02/28/2017  . 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 Tobacco use disorder 03/15/2016  . Tremor, essential 11/03/2014    Past Surgical History:  Procedure Laterality Date  . CHOLECYSTECTOMY    .  COLONOSCOPY  2016   Dr Orlena Sheldon  . ESOPHAGOGASTRODUODENOSCOPY  10/25/2015   Mild gastritis. Minimal hiatal hernia. Otherwise, normal EGD  . PARTIAL HYSTERECTOMY    . removal of deep brain stimulator  05/16/2016   Dr. Phylliss Blakes at [facility]  . TUBAL LIGATION    . UPPER GASTROINTESTINAL ENDOSCOPY  2004    Current Medications: Current Meds  Medication Sig  . albuterol (PROVENTIL HFA;VENTOLIN HFA) 108 (90 Base) MCG/ACT inhaler  Inhale into the lungs in the morning and at bedtime.   Marland Kitchen aspirin EC 81 MG tablet Take 1 tablet (81 mg total) by mouth daily. Swallow whole.  . budesonide-formoterol (SYMBICORT) 160-4.5 MCG/ACT inhaler INHALE TWO PUFFS TWICE DAILY  . citalopram (CELEXA) 20 MG tablet Take 20 mg by mouth daily.  . Coenzyme Q10 10 MG capsule Take 10 mg by mouth daily.  . cyclobenzaprine (FLEXERIL) 10 MG tablet Take 10 mg by mouth.  . fluticasone (FLONASE) 50 MCG/ACT nasal spray Use 2 sprays in each nostril once daily  . gabapentin (NEURONTIN) 300 MG capsule Take 300 mg by mouth at bedtime.  . isosorbide mononitrate (IMDUR) 30 MG 24 hr tablet Take 30 mg by mouth daily.  Marland Kitchen levothyroxine (SYNTHROID) 137 MCG tablet Take 137 mcg by mouth daily.  . metoprolol succinate (TOPROL XL) 25 MG 24 hr tablet Take 0.5 tablets (12.5 mg total) by mouth daily.  . nitroGLYCERIN (NITROSTAT) 0.4 MG SL tablet DISSOLVE 1 TABLET UNDER THE TONGUE EVERY 5 MINUTES AS NEEDED FOR CHEST PAIN. DO NOT EXCEED A TOTAL OF 3 DOSES IN 15 MINUTES.  . pantoprazole (PROTONIX) 40 MG tablet Take 1 tablet (40 mg total) by mouth 2 (two) times daily.  . potassium chloride SA (KLOR-CON) 20 MEQ tablet Take 1 tablet (20 mEq total) by mouth daily. (Patient taking differently: Take 20 mEq by mouth as needed.)  . Vitamin D, Ergocalciferol, (DRISDOL) 50000 units CAPS capsule TAKE ONE CAPSULE BY MOUTH ONCE A WEEK     Allergies:   Penicillins, Vancomycin, and Cephalexin   Social History   Socioeconomic History  . Marital status: Married    Spouse name: Not on file  . Number of children: 2  . Years of education: Not on file  . Highest education level: Not on file  Occupational History  . Occupation: disabled  Tobacco Use  . Smoking status: Current Every Day Smoker    Packs/day: 1.00  . Smokeless tobacco: Never Used  Vaping Use  . Vaping Use: Never used  Substance and Sexual Activity  . Alcohol use: Not Currently  . Drug use: No  . Sexual activity: Not on  file  Other Topics Concern  . Not on file  Social History Narrative  . Not on file   Social Determinants of Health   Financial Resource Strain: Not on file  Food Insecurity: Not on file  Transportation Needs: Not on file  Physical Activity: Not on file  Stress: Not on file  Social Connections: Not on file     Family History: The patient's family history includes Bladder Cancer in her mother; Cirrhosis in her maternal uncle; Congestive Heart Failure in her father and mother; Lung cancer in her father; Ovarian cancer in her sister. There is no history of Colon cancer, Liver cancer, Colon polyps, Esophageal cancer, Stomach cancer, or Rectal cancer.  ROS:   Review of Systems  Constitution: Negative for decreased appetite, fever and weight gain.  HENT: Negative for congestion, ear discharge, hoarse voice and sore throat.  Eyes: Negative for discharge, redness, vision loss in right eye and visual halos.  Cardiovascular: Negative for chest pain, dyspnea on exertion, leg swelling, orthopnea and palpitations.  Respiratory: Negative for cough, hemoptysis, shortness of breath and snoring.   Endocrine: Negative for heat intolerance and polyphagia.  Hematologic/Lymphatic: Negative for bleeding problem. Does not bruise/bleed easily.  Skin: Negative for flushing, nail changes, rash and suspicious lesions.  Musculoskeletal: Negative for arthritis, joint pain, muscle cramps, myalgias, neck pain and stiffness.  Gastrointestinal: Negative for abdominal pain, bowel incontinence, diarrhea and excessive appetite.  Genitourinary: Negative for decreased libido, genital sores and incomplete emptying.  Neurological: Negative for brief paralysis, focal weakness, headaches and loss of balance.  Psychiatric/Behavioral: Negative for altered mental status, depression and suicidal ideas.  Allergic/Immunologic: Negative for HIV exposure and persistent infections.    EKGs/Labs/Other Studies Reviewed:    The  following studies were reviewed today:   EKG:  The ekg ordered today demonstrates   Recent Labs: 07/21/2019: Hemoglobin 12.4; Platelets 248; TSH 0.511 08/28/2019: BUN 10; Creatinine, Ser 0.93; Magnesium 1.9; Potassium 4.2; Sodium 140  Recent Lipid Panel No results found for: CHOL, TRIG, HDL, CHOLHDL, VLDL, LDLCALC, LDLDIRECT  Physical Exam:    VS:  BP 116/74   Pulse 80   Ht 5\' 8"  (1.727 m)   Wt 252 lb 12.8 oz (114.7 kg)   LMP  (LMP Unknown) Comment: partial hysterectomy  SpO2 99%   BMI 38.44 kg/m     Wt Readings from Last 3 Encounters:  12/24/19 252 lb 12.8 oz (114.7 kg)  10/30/19 246 lb 12.8 oz (111.9 kg)  08/28/19 251 lb (113.9 kg)     GEN: Well nourished, well developed in no acute distress HEENT: Normal NECK: No JVD; No carotid bruits LYMPHATICS: No lymphadenopathy CARDIAC: S1S2 noted,RRR, no murmurs, rubs, gallops RESPIRATORY:  Clear to auscultation without rales, wheezing or rhonchi  ABDOMEN: Soft, non-tender, non-distended, +bowel sounds, no guarding. EXTREMITIES: No edema, No cyanosis, no clubbing MUSCULOSKELETAL:  No deformity  SKIN: Warm and dry NEUROLOGIC:  Alert and oriented x 3, non-focal PSYCHIATRIC:  Normal affect, good insight  ASSESSMENT:    1. PAT (paroxysmal atrial tachycardia) ([place])   2. Medication management   3. Metabolic syndrome    PLAN:     She reports that she is experiencing some fatigue.  I am going to have the patient get a vitamin D level.  She may need to get a sleep study if her vitamin D level is normal.  Because sleep apnea may be contributing to his significant fatigue.  We will decrease her Crestor to 5 mg twice a day.  Her recent lipid profile shows significant improvement.  The patient understands the need to lose weight with diet and exercise. We have discussed specific strategies for this.   The patient is in agreement with the above plan. The patient left the office in stable condition.  The patient will follow up  in   Medication Adjustments/Labs and Tests Ordered: Current medicines are reviewed at length with the patient today.  Concerns regarding medicines are outlined above.  Orders Placed This Encounter  Procedures  . Basic Metabolic Panel (BMET)  . Magnesium  . Vitamin D (25 hydroxy)   Meds ordered this encounter  Medications  . rosuvastatin (CRESTOR) 5 MG tablet    Sig: Take 1 tablet (5 mg total) by mouth daily.    Dispense:  90 tablet    Refill:  3    Patient Instructions  Medication Instructions:  Your physician has recommended you make the following change in your medication:  START: Crestor 5mg  daily  *If you need a refill on your cardiac medications before your next appointment, please call your pharmacy*   Lab Work: Your physician recommends that you return for lab work in:   BMET, Magnesium, vitamin D   If you have labs (blood work) drawn today and your tests are completely normal, you will receive your results only by: Marland Kitchen MyChart Message (if you have MyChart) OR . A paper copy in the mail If you have any lab test that is abnormal or we need to change your treatment, we will call you to review the results.   Testing/Procedures: NONE   Follow-Up: At [facility], you and your health needs are our priority.  As part of our continuing mission to provide you with exceptional heart care, we have created designated Provider Care Teams.  These Care Teams include your primary Cardiologist (physician) and Advanced Practice Providers (APPs -  Physician Assistants and Nurse Practitioners) who all work together to provide you with the care you need, when you need it.  We recommend signing up for the patient portal called "MyChart".  Sign up information is provided on this After Visit Summary.  MyChart is used to connect with patients for Virtual Visits (Telemedicine).  Patients are able to view lab/test results, encounter notes, upcoming appointments, etc.  Non-urgent messages  can be sent to your provider as well.   To learn more about what you can do with MyChart, go to [email or web address].    Your next appointment:   6 month(s)  The format for your next appointment:   In Person  Provider:   Berniece Salines, DO   Other Instructions      Adopting a Healthy Lifestyle.  Know what a healthy weight is for you (roughly BMI <25) and aim to maintain this   Aim for 7+ servings of fruits and vegetables daily   65-80+ fluid ounces of water or unsweet tea for healthy kidneys   Limit to max 1 drink of alcohol per day; avoid smoking/tobacco   Limit animal fats in diet for cholesterol and heart health - choose grass fed whenever available   Avoid highly processed foods, and foods high in saturated/trans fats   Aim for low stress - take time to unwind and care for your mental health   Aim for 150 min of moderate intensity exercise weekly for heart health, and weights twice weekly for bone health   Aim for 7-9 hours of sleep daily   When it comes to diets, agreement about the perfect plan isnt easy to find, even among the experts. Experts at the [employer] developed an idea known as the Healthy Eating Plate. Just imagine a plate divided into logical, healthy portions.   The emphasis is on diet quality:   Load up on vegetables and fruits - one-half of your plate: Aim for color and variety, and remember that potatoes dont count.   Go for whole grains - one-quarter of your plate: Whole wheat, barley, wheat berries, quinoa, oats, brown rice, and foods made with them. If you want pasta, go with whole wheat pasta.   Protein power - one-quarter of your plate: Fish, chicken, beans, and nuts are all healthy, versatile protein sources. Limit red meat.   The diet, however, does go beyond the plate, offering a few other suggestions.   Use healthy plant oils, such as olive, canola, soy,  corn, sunflower and peanut. Check the labels, and avoid  partially hydrogenated oil, which have unhealthy trans fats.   If youre thirsty, drink water. Coffee and tea are good in moderation, but skip sugary drinks and limit milk and dairy products to one or two daily servings.   The type of carbohydrate in the diet is more important than the amount. Some sources of carbohydrates, such as vegetables, fruits, whole grains, and beans-are healthier than others.   Finally, stay active  Signed, Berniece Salines, DO  12/24/2019 4:41 PM    [facility]

## 2019-12-25 LAB — BASIC METABOLIC PANEL
BUN/Creatinine Ratio: 9 (ref 9–23)
BUN: 9 mg/dL (ref 6–24)
CO2: 26 mmol/L (ref 20–29)
Calcium: 9.3 mg/dL (ref 8.7–10.2)
Chloride: 102 mmol/L (ref 96–106)
Creatinine, Ser: 0.99 mg/dL (ref 0.57–1.00)
GFR calc Af Amer: 76 mL/min/{1.73_m2} (ref >59)
GFR calc non Af Amer: 66 mL/min/{1.73_m2} (ref >59)
Glucose: 99 mg/dL (ref 65–99)
Potassium: 4.3 mmol/L (ref 3.5–5.2)
Sodium: 141 mmol/L (ref 134–144)

## 2019-12-25 LAB — MAGNESIUM: Magnesium: 2.1 mg/dL (ref 1.6–2.3)

## 2019-12-25 LAB — VITAMIN D 25 HYDROXY (VIT D DEFICIENCY, FRACTURES): Vit D, 25-Hydroxy: 11.9 ng/mL — ABNORMAL LOW (ref 30.0–100.0)

## 2019-12-26 ENCOUNTER — Telehealth: Payer: Self-pay

## 2019-12-26 MED ORDER — VITAMIN D (ERGOCALCIFEROL) 1.25 MG (50000 UNIT) PO CAPS: 50000.0000 [IU] | ORAL_CAPSULE | ORAL | 0 refills | Status: DC

## 2019-12-26 NOTE — Telephone Encounter (Signed)
-----   Message from Berniece Salines, DO sent at 12/26/2019  9:10 AM EST ----- You have vitamin D deficiency, your vitamin D level is 11.9, which is very low.  Like to replete you 50,000 units weekly for 12 weeks.

## 2019-12-26 NOTE — Telephone Encounter (Signed)
Spoke with patient regarding results and recommendation.  Patient verbalizes understanding and is agreeable to plan of care. Advised patient to call back with any issues or concerns.  

## 2020-07-01 ENCOUNTER — Other Ambulatory Visit: Payer: Self-pay

## 2020-07-01 ENCOUNTER — Encounter: Payer: Self-pay | Admitting: Cardiology

## 2020-07-01 ENCOUNTER — Ambulatory Visit (INDEPENDENT_AMBULATORY_CARE_PROVIDER_SITE_OTHER): Payer: PRIVATE HEALTH INSURANCE | Admitting: Cardiology

## 2020-07-01 VITALS — BP 134/70 | HR 74 | Ht 68.0 in | Wt 258.2 lb

## 2020-07-01 DIAGNOSIS — I10 Essential (primary) hypertension: Secondary | ICD-10-CM

## 2020-07-01 DIAGNOSIS — E782 Mixed hyperlipidemia: Secondary | ICD-10-CM

## 2020-07-01 DIAGNOSIS — R600 Localized edema: Secondary | ICD-10-CM | POA: Diagnosis not present

## 2020-07-01 DIAGNOSIS — I491 Atrial premature depolarization: Secondary | ICD-10-CM | POA: Diagnosis not present

## 2020-07-01 DIAGNOSIS — R0602 Shortness of breath: Secondary | ICD-10-CM | POA: Diagnosis not present

## 2020-07-01 MED ORDER — BUMETANIDE 1 MG PO TABS
1.0000 mg | ORAL_TABLET | Freq: Every day | ORAL | 3 refills | Status: DC
Start: 2020-07-01 — End: 2022-11-21

## 2020-07-01 NOTE — Patient Instructions (Signed)
Medication Instructions:   Your physician has recommended you make the following change in your medication:  STOP: Lasix  START: Bumex 1 mg twice daily for 3 days then 1 mg once daily  *If you need a refill on your cardiac medications before your next appointment, please call your pharmacy*   Lab Work:  Your physician recommends that you return for lab work in:  TODAY: BMET, [place], CBC  If you have labs (blood work) drawn today and your tests are completely normal, you will receive your results only by: MyChart Message (if you have MyChart) OR A paper copy in the mail If you have any lab test that is abnormal or we need to change your treatment, we will call you to review the results.   Testing/Procedures:  None   Follow-Up: At [facility], you and your health needs are our priority.  As part of our continuing mission to provide you with exceptional heart care, we have created designated Provider Care Teams.  These Care Teams include your primary Cardiologist (physician) and Advanced Practice Providers (APPs -  Physician Assistants and Nurse Practitioners) who all work together to provide you with the care you need, when you need it.  We recommend signing up for the patient portal called "MyChart".  Sign up information is provided on this After Visit Summary.  MyChart is used to connect with patients for Virtual Visits (Telemedicine).  Patients are able to view lab/test results, encounter notes, upcoming appointments, etc.  Non-urgent messages can be sent to your provider as well.   To learn more about what you can do with MyChart, go to [email or web address].    Your next appointment:   6 week(s)  The format for your next appointment:   In Person    Other Instructions

## 2020-07-01 NOTE — Progress Notes (Signed)
Cardiology Office Note:    Date:  07/01/2020   ID:  Debra Wolf, DOB 03/31/1970, MRN [id number]  PCP:  Ronita Hipps, MD  Cardiologist:  Berniece Salines, DO  Electrophysiologist:  None   Referring MD: [facility] [place]   No chief complaint on file. Has had worsening leg swelling  History of Present Illness:    Debra Wolf is a 50 y.o. female with a hx of diabetes mellitus, hypertension, hyperlipidemia, ectopic atrial rhythm, mild coronary artery disease is here today for follow-up visit.  Last saw the patient December 2021 at that time she reported significant fatigue we will get a vitamin D level which was low we repleted the patient for 12 weeks for vitamin D deficiency. During that visit we also discussed sleep apnea may be playing a role she was set for sleep study with her pulmonologist at [facility] -she tells me that she was placed on nocturnal oxygen. She has had some improvement but recently she has had some shortness of breath as well as bilateral leg edema.  No other complaints at this time.    Past Medical History:  Diagnosis Date   Anxiety    Asthma    Benign essential hypertension    Chest pain 07/18/2017   Last Assessment & Plan:  Formatting of this note might be different from the original. She describes a midsternal chest heaviness times 60-90 minutes that was relieved by nitroglycerin.  Troponin is negative and EKG does not suggest acute ischemic changes.  Echo from April of this year showed normal cardiac size and function without valvular abnormalities.  CTA performed in August of this year sug   Chronic pain 04/24/2014   COPD (chronic obstructive pulmonary disease) (HCC)    COPD exacerbation ([place]) 07/08/2017   Last Assessment & Plan:  Formatting of this note might be different from the original. Takes Symbicort.  Needs to quit smoking.  Continue management per primary team.   Depression 10/26/2019   Diabetes ([place]) 02/22/2017   Diabetes mellitus ([place]) 02/22/2017    Formatting of this note might be different from the original. diet controlled Formatting of this note might be different from the original. diet controlled   DOE (dyspnea on exertion) 02/28/2017   Dyslipidemia    Dyspnea and respiratory abnormality 05/30/2012   Ectopic atrial rhythm    Esophageal reflux 05/30/2012   Essential hypertension 05/30/2012   Last Assessment & Plan:  Formatting of this note might be different from the original. Stable; continue management per primary team   GERD (gastroesophageal reflux disease)    Hypophosphatemia 10/26/2019   Hypothyroid    Hypoxia 07/08/2017   Leukocytosis 10/26/2019   Mass of ovary    Mixed hyperlipidemia 03/03/2019   Morbid obesity ([place]) 12/28/2016   Neck pain 04/27/2014   Nervousness 05/30/2012   Neuromuscular disorder ([place])    esentrial tremors   Pain in joint involving lower leg 05/30/2012   Pain medication agreement signed 07/06/2016   Overview:  Overview:  [facility] opiod agreement reviewed, signed and copy given to patient. Overview:  [facility] opiod agreement reviewed, signed and copy given to patient.   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 Minimal hiatal hernia. Otherwise, normal EGD   PARTIAL HYSTERECTOMY     removal of deep brain stimulator  05/16/2016   Dr. Phylliss Blakes at [place]  2004    Current Medications: Current Meds  Medication Sig   albuterol (PROVENTIL HFA;VENTOLIN HFA) 108 (90 Base) MCG/ACT inhaler Inhale into the lungs in the morning and at bedtime.    aspirin EC 81 MG tablet Take 1 tablet (81 mg total) by mouth daily. Swallow  whole.   budesonide-formoterol (SYMBICORT) 160-4.5 MCG/ACT inhaler INHALE TWO PUFFS TWICE DAILY   bumetanide (BUMEX) 1 MG tablet Take 1 tablet (1 mg total) by mouth daily. Take 1 mg twice daily for 3 days than once daily.   citalopram (CELEXA) 20 MG tablet Take 20 mg by mouth daily.   Coenzyme Q10 10 MG capsule Take 10 mg by mouth daily.   cyclobenzaprine (FLEXERIL) 10 MG tablet Take 10 mg by mouth.   fluticasone (FLONASE) 50 MCG/ACT nasal spray Use 2 sprays in each nostril once daily   gabapentin (NEURONTIN) 300 MG capsule Take 300 mg by mouth at bedtime.   isosorbide mononitrate (IMDUR) 30 MG 24 hr tablet Take 30 mg by mouth daily.   levothyroxine (SYNTHROID) 137 MCG tablet Take 137 mcg by mouth daily.   metoprolol succinate (TOPROL XL) 25 MG 24 hr tablet Take 0.5 tablets (12.5 mg total) by mouth daily.   nitroGLYCERIN (NITROSTAT) 0.4 MG SL tablet DISSOLVE 1 TABLET UNDER THE TONGUE EVERY 5 MINUTES AS NEEDED FOR CHEST PAIN. DO NOT EXCEED A TOTAL OF 3 DOSES IN 15 MINUTES.   pantoprazole (PROTONIX) 40 MG tablet Take 1 tablet (40 mg total) by mouth 2 (two) times daily.   potassium chloride SA (KLOR-CON) 20 MEQ tablet Take 1 tablet (20 mEq total) by mouth daily. (Patient taking differently: Take 20 mEq by mouth as needed.)   Vitamin D, Ergocalciferol, (DRISDOL) 1.25 MG (50000 UNIT) CAPS capsule Take 1 capsule (50,000 Units total) by mouth every 7 (seven) days.     Allergies:   Penicillins, Vancomycin, and Cephalexin   Social History   Socioeconomic History   Marital status: Married    Spouse name: Not on file   Number of children: 2   Years of education: Not on file   Highest education level: Not on file  Occupational History   Occupation: disabled  Tobacco Use   Smoking status: Every Day    Packs/day: 1.00    Pack years: 0.00    Types: Cigarettes   Smokeless tobacco: Never  Vaping Use   Vaping Use: Never used  Substance and Sexual Activity   Alcohol use: Not Currently   Drug use:  No   Sexual activity: Not on file  Other Topics Concern   Not on file  Social History Narrative   Not on file   Social Determinants of Health   Financial Resource Strain: Not on file  Food Insecurity: Not on file  Transportation Needs: Not on file  Physical Activity: Not on file  Stress: Not on file  Social Connections: Not on file     Family History: The patient's family history includes Bladder Cancer in her mother; Cirrhosis in her maternal uncle; Congestive Heart Failure in her father and mother; Lung cancer in her father; Ovarian cancer in her sister. There is no history of Colon cancer, Liver cancer, Colon polyps, Esophageal cancer, Stomach cancer, or Rectal cancer.  ROS:  Review of Systems  Constitution: Negative for decreased appetite, fever and weight gain.  HENT: Negative for congestion, ear discharge, hoarse voice and sore throat.   Eyes: Negative for discharge, redness, vision loss in right eye and visual halos.  Cardiovascular: Negative for chest pain, dyspnea on exertion, leg swelling, orthopnea and palpitations.  Respiratory: Negative for cough, hemoptysis, shortness of breath and snoring.   Endocrine: Negative for heat intolerance and polyphagia.  Hematologic/Lymphatic: Negative for bleeding problem. Does not bruise/bleed easily.  Skin: Negative for flushing, nail changes, rash and suspicious lesions.  Musculoskeletal: Negative for arthritis, joint pain, muscle cramps, myalgias, neck pain and stiffness.  Gastrointestinal: Negative for abdominal pain, bowel incontinence, diarrhea and excessive appetite.  Genitourinary: Negative for decreased libido, genital sores and incomplete emptying.  Neurological: Negative for brief paralysis, focal weakness, headaches and loss of balance.  Psychiatric/Behavioral: Negative for altered mental status, depression and suicidal ideas.  Allergic/Immunologic: Negative for HIV exposure and persistent infections.    EKGs/Labs/Other  Studies Reviewed:    The following studies were reviewed today:   EKG:  T none today  Recent Labs: 07/21/2019: Hemoglobin 12.4; Platelets 248; TSH 0.511 12/24/2019: BUN 9; Creatinine, Ser 0.99; Magnesium 2.1; Potassium 4.3; Sodium 141  Recent Lipid Panel No results found for: CHOL, TRIG, HDL, CHOLHDL, VLDL, LDLCALC, LDLDIRECT  Physical Exam:    VS:  BP 134/70   Pulse 74   Ht 5\' 8"  (1.727 m)   Wt 258 lb 3.2 oz (117.1 kg)   LMP  (LMP Unknown) Comment: partial hysterectomy  SpO2 98%   BMI 39.26 kg/m     Wt Readings from Last 3 Encounters:  07/01/20 258 lb 3.2 oz (117.1 kg)  12/24/19 252 lb 12.8 oz (114.7 kg)  10/30/19 246 lb 12.8 oz (111.9 kg)     GEN: Well nourished, well developed in no acute distress HEENT: Normal NECK: No JVD; No carotid bruits LYMPHATICS: No lymphadenopathy CARDIAC: S1S2 noted,RRR, no murmurs, rubs, gallops RESPIRATORY:  Clear to auscultation without rales, wheezing or rhonchi  ABDOMEN: Soft, non-tender, non-distended, +bowel sounds, no guarding. EXTREMITIES: +2 bilateral leg edema , No cyanosis, no clubbing MUSCULOSKELETAL:  No deformity  SKIN: Warm and dry NEUROLOGIC:  Alert and oriented x 3, non-focal PSYCHIATRIC:  Normal affect, good insight  ASSESSMENT:    1. SOB (shortness of breath)   2. Bilateral leg edema   3. Essential hypertension   4. Ectopic atrial rhythm   5. Mixed hyperlipidemia    PLAN:     1.  He appears that her Lasix may be resistant and she is not having any improvement with this medication I am going to stop her Lasix and started patient on Bumex we will do Bumex 1 mg twice a day for 3 days and then to 1 mg daily.  We will continue with the potassium supplement.  We will get blood work to assess her BMP, mag as well as CBC.  2.  No angina symptoms continue patient on her aspirin as well as statin medication.  3.  Palpitations has improved significantly continue her metoprolol succinate  4.  The patient understands the  need to lose weight with diet and exercise. We have discussed specific strategies for this..  She tells me her barriers to exercise is the fact that she gets significant knee pain.  5.  She is in smoking quitting program which she thinks is helping her.  The patient is in agreement with the above plan. The patient left the office in  stable condition.  The patient will follow up in   Medication Adjustments/Labs and Tests Ordered: Current medicines are reviewed at length with the patient today.  Concerns regarding medicines are outlined above.  Orders Placed This Encounter  Procedures   Basic metabolic panel   CBC with Differential/Platelet   Magnesium   Meds ordered this encounter  Medications   bumetanide (BUMEX) 1 MG tablet    Sig: Take 1 tablet (1 mg total) by mouth daily. Take 1 mg twice daily for 3 days than once daily.    Dispense:  90 tablet    Refill:  3    Remove "Take 1 mg twice daily for 3 days than once daily. " After 1st fill.    Patient Instructions  Medication Instructions:   Your physician has recommended you make the following change in your medication:  STOP: Lasix  START: Bumex 1 mg twice daily for 3 days then 1 mg once daily  *If you need a refill on your cardiac medications before your next appointment, please call your pharmacy*   Lab Work:  Your physician recommends that you return for lab work in:  TODAY: BMET, [place], CBC  If you have labs (blood work) drawn today and your tests are completely normal, you will receive your results only by: MyChart Message (if you have MyChart) OR A paper copy in the mail If you have any lab test that is abnormal or we need to change your treatment, we will call you to review the results.   Testing/Procedures:  None   Follow-Up: At [facility], you and your health needs are our priority.  As part of our continuing mission to provide you with exceptional heart care, we have created designated Provider Care  Teams.  These Care Teams include your primary Cardiologist (physician) and Advanced Practice Providers (APPs -  Physician Assistants and Nurse Practitioners) who all work together to provide you with the care you need, when you need it.  We recommend signing up for the patient portal called "MyChart".  Sign up information is provided on this After Visit Summary.  MyChart is used to connect with patients for Virtual Visits (Telemedicine).  Patients are able to view lab/test results, encounter notes, upcoming appointments, etc.  Non-urgent messages can be sent to your provider as well.   To learn more about what you can do with MyChart, go to [email or web address].    Your next appointment:   6 week(s)  The format for your next appointment:   In Person    Other Instructions    Adopting a Healthy Lifestyle.  Know what a healthy weight is for you (roughly BMI <25) and aim to maintain this   Aim for 7+ servings of fruits and vegetables daily   65-80+ fluid ounces of water or unsweet tea for healthy kidneys   Limit to max 1 drink of alcohol per day; avoid smoking/tobacco   Limit animal fats in diet for cholesterol and heart health - choose grass fed whenever available   Avoid highly processed foods, and foods high in saturated/trans fats   Aim for low stress - take time to unwind and care for your mental health   Aim for 150 min of moderate intensity exercise weekly for heart health, and weights twice weekly for bone health   Aim for 7-9 hours of sleep daily   When it comes to diets, agreement about the perfect plan isnt easy to find, even among the experts. Experts at the [facility] [employer] [facility]  [employer] developed an idea known as the Healthy Eating Plate. Just imagine a plate divided into logical, healthy portions.   The emphasis is on diet quality:   Load up on vegetables and fruits - one-half of your plate: Aim for color and variety, and remember that potatoes dont count.    Go for whole grains - one-quarter of your plate: Whole wheat, barley, wheat berries, quinoa, oats, brown rice, and foods made with them. If you want pasta, go with whole wheat pasta.   Protein power - one-quarter of your plate: Fish, chicken, beans, and nuts are all healthy, versatile protein sources. Limit red meat.   The diet, however, does go beyond the plate, offering a few other suggestions.   Use healthy plant oils, such as olive, canola, soy, corn, sunflower and peanut. Check the labels, and avoid partially hydrogenated oil, which have unhealthy trans fats.   If youre thirsty, drink water. Coffee and tea are good in moderation, but skip sugary drinks and limit milk and dairy products to one or two daily servings.   The type of carbohydrate in the diet is more important than the amount. Some sources of carbohydrates, such as vegetables, fruits, whole grains, and beans-are healthier than others.   Finally, stay active  Signed, Berniece Salines, DO  07/01/2020 4:27 PM    [facility]

## 2020-07-02 LAB — BASIC METABOLIC PANEL
BUN/Creatinine Ratio: 9 (ref 9–23)
BUN: 8 mg/dL (ref 6–24)
CO2: 26 mmol/L (ref 20–29)
Calcium: 9.3 mg/dL (ref 8.7–10.2)
Chloride: 98 mmol/L (ref 96–106)
Creatinine, Ser: 0.91 mg/dL (ref 0.57–1.00)
Glucose: 101 mg/dL — ABNORMAL HIGH (ref 65–99)
Potassium: 4.1 mmol/L (ref 3.5–5.2)
Sodium: 137 mmol/L (ref 134–144)
eGFR: 76 mL/min/{1.73_m2} (ref >59)

## 2020-07-02 LAB — CBC WITH DIFFERENTIAL/PLATELET
Basophils Absolute: 0.1 10*3/uL (ref 0.0–0.2)
Basos: 1 %
EOS (ABSOLUTE): 0.3 10*3/uL (ref 0.0–0.4)
Eos: 3 %
Hematocrit: 39.1 % (ref 34.0–46.6)
Hemoglobin: 12.7 g/dL (ref 11.1–15.9)
Immature Grans (Abs): 0 10*3/uL (ref 0.0–0.1)
Immature Granulocytes: 0 %
Lymphocytes Absolute: 3.3 10*3/uL — ABNORMAL HIGH (ref 0.7–3.1)
Lymphs: 31 %
MCH: 29.2 pg (ref 26.6–33.0)
MCHC: 32.5 g/dL (ref 31.5–35.7)
MCV: 90 fL (ref 79–97)
Monocytes Absolute: 0.6 10*3/uL (ref 0.1–0.9)
Monocytes: 6 %
Neutrophils Absolute: 6.4 10*3/uL (ref 1.4–7.0)
Neutrophils: 59 %
Platelets: 248 10*3/uL (ref 150–450)
RBC: 4.35 x10E6/uL (ref 3.77–5.28)
RDW: 13.7 % (ref 11.7–15.4)
WBC: 10.7 10*3/uL (ref 3.4–10.8)

## 2020-07-02 LAB — MAGNESIUM: Magnesium: 2 mg/dL (ref 1.6–2.3)

## 2020-08-13 ENCOUNTER — Telehealth: Payer: Self-pay | Admitting: *Deleted

## 2020-08-13 DIAGNOSIS — R1319 Other dysphagia: Secondary | ICD-10-CM

## 2020-08-13 NOTE — Telephone Encounter (Signed)
Patient returned call. Best contact [phone]

## 2020-08-13 NOTE — Telephone Encounter (Signed)
Message received that patient, Debra Wolf requested appt with Dr Lyndel Safe at [facility] office. Patient is scheduled for Wed 08/18/20 '@11'$ :10am. Patient further went to state that now she did not need an appt and had no transportation due to her husband "driving big trucks". Patient now states she just needs "something for my ibsc" Attempted to contact patient for further information, patient was not home. Patients spouse was left message and number to call office back.

## 2020-08-16 NOTE — Telephone Encounter (Signed)
MiraLAX 17 g p.o. once a day In addition, if she is still constipated, trial of Motegrity '2mg'$  po QD (may have to get samples) RG

## 2020-08-16 NOTE — Telephone Encounter (Signed)
Spoke with patient states due to transportation issues she was unable to keep appt that was already scheduled. Patient has been reschuled for 9/30 with Dr Lyndel Safe in [facility] office.

## 2020-08-17 ENCOUNTER — Telehealth (INDEPENDENT_AMBULATORY_CARE_PROVIDER_SITE_OTHER): Payer: PRIVATE HEALTH INSURANCE | Admitting: Cardiology

## 2020-08-17 ENCOUNTER — Other Ambulatory Visit: Payer: Self-pay

## 2020-08-17 VITALS — HR 85 | Ht 68.0 in

## 2020-08-17 DIAGNOSIS — E782 Mixed hyperlipidemia: Secondary | ICD-10-CM

## 2020-08-17 DIAGNOSIS — I491 Atrial premature depolarization: Secondary | ICD-10-CM

## 2020-08-17 DIAGNOSIS — I2510 Atherosclerotic heart disease of native coronary artery without angina pectoris: Secondary | ICD-10-CM

## 2020-08-17 DIAGNOSIS — I10 Essential (primary) hypertension: Secondary | ICD-10-CM

## 2020-08-17 DIAGNOSIS — F17200 Nicotine dependence, unspecified, uncomplicated: Secondary | ICD-10-CM | POA: Diagnosis not present

## 2020-08-17 DIAGNOSIS — E0800 Diabetes mellitus due to underlying condition with hyperosmolarity without nonketotic hyperglycemic-hyperosmolar coma (NKHHC): Secondary | ICD-10-CM

## 2020-08-17 DIAGNOSIS — E785 Hyperlipidemia, unspecified: Secondary | ICD-10-CM

## 2020-08-17 MED ORDER — NITROGLYCERIN 0.4 MG SL SUBL: SUBLINGUAL_TABLET | SUBLINGUAL | 6 refills | Status: DC

## 2020-08-17 MED ORDER — ISOSORBIDE MONONITRATE ER 30 MG PO TB24: 30.0000 mg | ORAL_TABLET | Freq: Every day | ORAL | 3 refills | Status: DC

## 2020-08-17 MED ORDER — METOPROLOL SUCCINATE ER 25 MG PO TB24: 12.5000 mg | ORAL_TABLET | Freq: Every day | ORAL | 3 refills | Status: DC

## 2020-08-17 MED ORDER — ROSUVASTATIN CALCIUM 5 MG PO TABS: 5.0000 mg | ORAL_TABLET | Freq: Every day | ORAL | 3 refills | Status: DC

## 2020-08-17 MED ORDER — MOTEGRITY 2 MG PO TABS: 2.0000 mg | ORAL_TABLET | Freq: Every day | ORAL | 0 refills | Status: DC

## 2020-08-17 NOTE — Progress Notes (Addendum)
Virtual Visit via Video Note   This visit type was conducted due to national recommendations for restrictions regarding the COVID-19 Pandemic (e.g. social distancing) in an effort to limit this patient's exposure and mitigate transmission in our community.  Due to her co-morbid illnesses, this patient is at least at moderate risk for complications without adequate follow up.  This format is felt to be most appropriate for this patient at this time.  All issues noted in this document were discussed and addressed.  A limited physical exam was performed with this format.  Please refer to the patient's chart for her consent to telehealth for [facility].      Date:  08/17/2020   ID:  Debra Wolf, DOB May 08, 1970, MRN [id number]  Patient Location: Home virtual Visit via Video I connected with the patient on August 17, 2020 by a video enabled telemedicine application and verified that I am speaking with the correct person using two identifiers.   Provider Location: Office/Clinic  PCP:  Ronita Hipps, MD  Cardiologist:  Berniece Salines, DO  Electrophysiologist:  None   Evaluation Performed:  Follow-Up Visit  Chief Complaint:    History of Present Illness:    Debra Wolf is a 50 y.o. female with diabetes mellitus, coronary artery disease on coronary CTA, hypertension, hyperlipidemia,ectopic atrial rhythm.  Last saw the patient on July 02, 2019 at that time we stopped her Lasix and started her on Bumex 1 mg.  She tells me that she is doing well with this.  She also has recently been started on oxygen by her pulmonary doctor which she usually uses at night.  No other complaints at this time.   The patient does not have symptoms concerning for COVID-19 infection (fever, chills, cough, or new shortness of breath).    Past Medical History:  Diagnosis Date   Anxiety    Asthma    Benign essential hypertension    Chest pain 07/18/2017   Last Assessment & Plan:  Formatting of this note might be  different from the original. She describes a midsternal chest heaviness times 60-90 minutes that was relieved by nitroglycerin.  Troponin is negative and EKG does not suggest acute ischemic changes.  Echo from April of this year showed normal cardiac size and function without valvular abnormalities.  CTA performed in August of this year sug   Chronic pain 04/24/2014   COPD (chronic obstructive pulmonary disease) (HCC)    COPD exacerbation ([place]) 07/08/2017   Last Assessment & Plan:  Formatting of this note might be different from the original. Takes Symbicort.  Needs to quit smoking.  Continue management per primary team.   Depression 10/26/2019   Diabetes ([place]) 02/22/2017   Diabetes mellitus ([place]) 02/22/2017   Formatting of this note might be different from the original. diet controlled Formatting of this note might be different from the original. diet controlled   DOE (dyspnea on exertion) 02/28/2017   Dyslipidemia    Dyspnea and respiratory abnormality 05/30/2012   Ectopic atrial rhythm    Esophageal reflux 05/30/2012   Essential hypertension 05/30/2012   Last Assessment & Plan:  Formatting of this note might be different from the original. Stable; continue management per primary team   GERD (gastroesophageal reflux disease)    Hypophosphatemia 10/26/2019   Hypothyroid    Hypoxia 07/08/2017   Leukocytosis 10/26/2019   Mass of ovary    Mixed hyperlipidemia 03/03/2019   Morbid obesity ([place]) 12/28/2016   Neck pain 04/27/2014   Nervousness  05/30/2012   Neuromuscular disorder (HCC)    esentrial tremors   Pain in joint involving lower leg 05/30/2012   Pain medication agreement signed 07/06/2016   Overview:  Overview:  [facility] opiod agreement reviewed, signed and copy given to patient. Overview:  [facility] opiod agreement reviewed, signed and copy given to patient.   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 Minimal hiatal hernia. Otherwise, normal EGD   PARTIAL HYSTERECTOMY     removal of deep brain stimulator  05/16/2016   Dr. Phylliss Blakes at [place]  2004     Current Meds  Medication Sig   albuterol (PROVENTIL HFA;VENTOLIN HFA) 108 (90 Base) MCG/ACT inhaler Inhale into the lungs in the morning and at bedtime.    aspirin EC 81 MG tablet Take 1 tablet (81 mg total) by mouth daily. Swallow whole.   budesonide-formoterol (SYMBICORT) 160-4.5 MCG/ACT inhaler INHALE TWO PUFFS TWICE DAILY   bumetanide (BUMEX) 1 MG tablet Take 1 tablet (1 mg total) by mouth daily. Take 1 mg twice daily for 3 days than once daily.   citalopram (CELEXA) 20 MG tablet Take 20 mg by mouth daily.   Coenzyme Q10 10 MG capsule Take 10 mg by mouth daily.   cyclobenzaprine (FLEXERIL) 10 MG tablet Take 10 mg by mouth daily as needed for muscle spasms.   fluticasone (FLONASE) 50 MCG/ACT nasal spray Use 2 sprays in each nostril once daily   gabapentin (NEURONTIN) 300 MG capsule Take 300 mg by mouth at bedtime.   levothyroxine (SYNTHROID) 137 MCG tablet Take 137 mcg by mouth daily.   pantoprazole (PROTONIX) 40 MG tablet Take 1 tablet (40 mg total) by mouth 2 (two) times daily.   potassium chloride SA (KLOR-CON) 20 MEQ tablet Take 20 mEq by mouth as needed (for potassium).   Prucalopride Succinate (MOTEGRITY) 2 MG TABS Take 1 tablet (2 mg total) by mouth daily.   Vitamin D, Ergocalciferol, (DRISDOL) 1.25 MG (50000 UNIT) CAPS capsule Take 1 capsule (50,000 Units total) by mouth every 7 (seven) days.   [DISCONTINUED] isosorbide mononitrate (IMDUR) 30 MG 24 hr tablet Take 30 mg by mouth daily.   [DISCONTINUED] metoprolol succinate  (TOPROL XL) 25 MG 24 hr tablet Take 0.5 tablets (12.5 mg total) by mouth daily.   [DISCONTINUED] nitroGLYCERIN (NITROSTAT) 0.4 MG SL tablet DISSOLVE 1 TABLET UNDER THE TONGUE EVERY 5 MINUTES AS NEEDED FOR CHEST PAIN. DO NOT EXCEED A TOTAL OF 3 DOSES IN 15 MINUTES.   [DISCONTINUED] rosuvastatin (CRESTOR) 5 MG tablet Take 1 tablet (5 mg total) by mouth daily.     Allergies:   Penicillins, Vancomycin, and Cephalexin   Social History   Tobacco Use   Smoking status: Every Day    Packs/day: 1.00    Types: Cigarettes   Smokeless tobacco: Never  Vaping Use   Vaping Use: Never used  Substance Use Topics   Alcohol use: Not Currently   Drug use: No     Family Hx: The patient's family history includes Bladder Cancer in her mother; Cirrhosis in her maternal uncle; Congestive Heart Failure in her father and mother;  Lung cancer in her father; Ovarian cancer in her sister. There is no history of Colon cancer, Liver cancer, Colon polyps, Esophageal cancer, Stomach cancer, or Rectal cancer.  ROS:   Please see the history of present illness.     All other systems reviewed and are negative.   Prior CV studies:   The following studies were reviewed today:  Coronary CTA done in September 05, 2019 Coronary calcium score is 0.   Coronary arteries: Normal coronary origins.  Right dominance.   Right Coronary Artery: Mild atherosclerotic plaque in the mid RCA, 25-49% stenosis.   Left Main Coronary Artery: No detectable plaque or stenosis. LM trifurcates into LAD, ramus intermedius, and left circumflex arteries.   Left Anterior Descending Coronary Artery: Minimal atherosclerotic plaque in the mid LAD, <25% stenosis.   Ramus intermedius: Minimal atherosclerotic plaque in proximal RI, <25% stenosis.   Left Circumflex Artery: No detectable plaque or stenosis.   Aorta: Normal size, 30 mm at the mid ascending aorta (level of the PA bifurcation) measured double oblique. No calcifications.  No dissection.   Aortic Valve: Trivial annular calcifications.   Other findings:   Normal pulmonary vein drainage into the left atrium.   Normal left atrial appendage without a thrombus.   Mild dilation of main pulmonary artery, 30 mm.   IMPRESSION: 1. Mild CAD in mid RCA, CADRADS = 2.   2. Coronary calcium score of 0.   3. Normal coronary origin with right dominance.   4. Mild dilation of main pulmonary artery, 30 mm.   Transthoracic echocardiogram April 15, 2019 IMPRESSIONS     1. Left ventricular ejection fraction, by estimation, is 60 to 65%. The  left ventricle has normal function. The left ventricle has no regional  wall motion abnormalities. Left ventricular diastolic parameters were  normal.   2. Right ventricular systolic function is normal. The right ventricular  size is normal.   3. Left atrial size was mildly dilated.   4. The mitral valve is normal in structure. No evidence of mitral valve  regurgitation. No evidence of mitral stenosis.   5. The aortic valve is normal in structure. Aortic valve regurgitation is  not visualized. No aortic stenosis is present.   6. The inferior vena cava is normal in size with greater than 50%  respiratory variability, suggesting right atrial pressure of 3 mmHg.   FINDINGS   Left Ventricle: Left ventricular ejection fraction, by estimation, is 60  to 65%. The left ventricle has normal function. The left ventricle has no  regional wall motion abnormalities. The left ventricular internal cavity  size was normal in size. There is   no left ventricular hypertrophy. Left ventricular diastolic parameters  were normal.   Right Ventricle: The right ventricular size is normal. No increase in  right ventricular wall thickness. Right ventricular systolic function is  normal.   Left Atrium: Left atrial size was mildly dilated.   Right Atrium: Right atrial size was normal in size.   Pericardium: There is no evidence of  pericardial effusion.   Mitral Valve: The mitral valve is normal in structure. Normal mobility of  the mitral valve leaflets. No evidence of mitral valve regurgitation. No  evidence of mitral valve stenosis.   Tricuspid Valve: The tricuspid valve is normal in structure. Tricuspid  valve regurgitation is mild . No evidence of tricuspid stenosis.   Aortic Valve: The aortic valve is normal in structure. Aortic valve  regurgitation is not visualized. No aortic stenosis is present.  Pulmonic Valve: The pulmonic valve was normal in structure. Pulmonic valve  regurgitation is not visualized. No evidence of pulmonic stenosis.   Aorta: The aortic root is normal in size and structure.   Venous: The inferior vena cava is normal in size with greater than 50%  respiratory variability, suggesting right atrial pressure of 3 mmHg.   IAS/Shunts: No atrial level shunt detected by color flow Doppler.     Labs/Other Tests and Data Reviewed:    EKG:  No ECG reviewed.  Recent Labs: 07/01/2020: BUN 8; Creatinine, Ser 0.91; Hemoglobin 12.7; Magnesium 2.0; Platelets 248; Potassium 4.1; Sodium 137   Recent Lipid Panel No results found for: CHOL, TRIG, HDL, CHOLHDL, LDLCALC, LDLDIRECT  Wt Readings from Last 3 Encounters:  07/01/20 258 lb 3.2 oz (117.1 kg)  12/24/19 252 lb 12.8 oz (114.7 kg)  10/30/19 246 lb 12.8 oz (111.9 kg)     Objective:    Vital Signs:  Pulse 85   Ht '5\' 8"'$  (1.727 m)   LMP  (LMP Unknown) Comment: partial hysterectomy  SpO2 99%   BMI 39.26 kg/m    Virtual visit no physical exam.   ASSESSMENT & PLAN:    Shortness of breath Hypertension Supraventricular tachycardia Ectopic atrial rhythm Hyperlipidemia CAD  \She has had improvement with the Bumex.  No changes will be made here.  She also tells me when she was started on the oxygen she has had a great deal of improvement.  She is following with pulmonary. No angina symptoms no medication changes. Smoking cessation  advised    COVID-19 Education: The signs and symptoms of COVID-19 were discussed with the patient and how to seek care for testing (follow up with PCP or arrange E-visit).  The importance of social distancing was discussed today.  Time:   Today, I have spent  minutes with the patient with telehealth technology discussing the above problems.     Medication Adjustments/Labs and Tests Ordered: Current medicines are reviewed at length with the patient today.  Concerns regarding medicines are outlined above.   Tests Ordered: No orders of the defined types were placed in this encounter.   Medication Changes: Meds ordered this encounter  Medications   rosuvastatin (CRESTOR) 5 MG tablet    Sig: Take 1 tablet (5 mg total) by mouth daily.    Dispense:  90 tablet    Refill:  3   nitroGLYCERIN (NITROSTAT) 0.4 MG SL tablet    Sig: DISSOLVE 1 TABLET UNDER THE TONGUE EVERY 5 MINUTES AS NEEDED FOR CHEST PAIN. DO NOT EXCEED A TOTAL OF 3 DOSES IN 15 MINUTES.    Dispense:  25 tablet    Refill:  6   metoprolol succinate (TOPROL XL) 25 MG 24 hr tablet    Sig: Take 0.5 tablets (12.5 mg total) by mouth daily.    Dispense:  45 tablet    Refill:  3   isosorbide mononitrate (IMDUR) 30 MG 24 hr tablet    Sig: Take 1 tablet (30 mg total) by mouth daily.    Dispense:  90 tablet    Refill:  3    Follow Up:  In Person in 12 month(s)  Signed, Berniece Salines, DO  08/17/2020 3:12 PM    [facility]

## 2020-08-17 NOTE — Telephone Encounter (Signed)
Attempted to contact patient. No answer. VM left on patients phone

## 2020-08-17 NOTE — Patient Instructions (Signed)

## 2020-08-17 NOTE — Telephone Encounter (Signed)
Spoke with patient informed patient of MD rec's. Patient declined samples and requested me to call medication into verified pharmacy. Medication sent to pharmacy nothing further needed at this time.

## 2020-08-17 NOTE — Addendum Note (Signed)
Addended by: Thedore Mins D on: 08/17/2020 10:21 AM   Modules accepted: Orders

## 2020-08-18 ENCOUNTER — Ambulatory Visit: Payer: PRIVATE HEALTH INSURANCE | Admitting: Gastroenterology

## 2020-08-18 ENCOUNTER — Telehealth: Payer: Self-pay

## 2020-08-18 NOTE — Telephone Encounter (Signed)
Received PA Approval for Motegrity 2 MG Tablet through 08/17/2021 from [facility]

## 2020-10-08 ENCOUNTER — Ambulatory Visit: Payer: PRIVATE HEALTH INSURANCE | Admitting: Gastroenterology

## 2020-10-26 ENCOUNTER — Telehealth: Payer: Self-pay

## 2020-10-26 NOTE — Telephone Encounter (Signed)
   [facility] Pre-operative Risk Assessment    Patient Name: Debra Wolf  DOB: [birth year] MRN: [id number]  HEARTCARE STAFF:  - IMPORTANT!!!!!! Under Visit Info/Reason for Call, type in Other and utilize the format Clearance MM/DD/YY or Clearance TBD. Do not use dashes or single digits. - Please review there is not already an duplicate clearance open for this procedure. - If request is for dental extraction, please clarify the # of teeth to be extracted. - If the patient is currently at the dentist's office, call Pre-Op Callback Staff (MA/nurse) to input urgent request.  - If the patient is not currently in the dentist office, please route to the Pre-Op pool.  Request for surgical clearance:  What type of surgery is being performed? Right total knee arthroplasty  When is this surgery scheduled? TBD  What type of clearance is required (medical clearance vs. Pharmacy clearance to hold med vs. Both)? Medical  Are there any medications that need to be held prior to surgery and how long?   Practice name and name of physician performing surgery? [facility]- Dr. Creig Hines  What is the office phone number? [phone]   7.   What is the office fax number? [phone]  8.   Anesthesia type (None, local, MAC, general) ? General   Lowella Grip 10/26/2020, 4:51 PM  _________________________________________________________________   (provider comments below)

## 2020-10-27 NOTE — Telephone Encounter (Signed)
   Primary Cardiologist: Berniece Salines, DO  Chart reviewed as part of pre-operative protocol coverage. Given past medical history and time since last visit, based on ACC/AHA guidelines, Debra Wolf would be at acceptable risk for the planned procedure without further cardiovascular testing.   Her RCRI is a class II risk, 0.9% risk of major cardiac event.  I will route this recommendation to the requesting party via Epic fax function and remove from pre-op pool.  Please call with questions.  Jossie Ng. Tashanna Dolin NP-C    10/27/2020, 7:14 AM [facility] [address] Office [phone] Fax [phone]

## 2020-11-15 ENCOUNTER — Ambulatory Visit: Payer: PRIVATE HEALTH INSURANCE | Admitting: Gastroenterology

## 2021-02-07 DIAGNOSIS — M1711 Unilateral primary osteoarthritis, right knee: Secondary | ICD-10-CM | POA: Diagnosis not present

## 2021-02-11 ENCOUNTER — Ambulatory Visit: Payer: PRIVATE HEALTH INSURANCE | Admitting: Cardiology

## 2021-03-29 ENCOUNTER — Other Ambulatory Visit: Payer: Self-pay

## 2021-03-30 ENCOUNTER — Other Ambulatory Visit: Payer: Self-pay

## 2021-03-30 ENCOUNTER — Encounter: Payer: Self-pay | Admitting: Cardiology

## 2021-03-30 ENCOUNTER — Ambulatory Visit (INDEPENDENT_AMBULATORY_CARE_PROVIDER_SITE_OTHER): Payer: PRIVATE HEALTH INSURANCE | Admitting: Cardiology

## 2021-03-30 VITALS — BP 130/70 | HR 66 | Ht 68.0 in | Wt 248.0 lb

## 2021-03-30 DIAGNOSIS — E782 Mixed hyperlipidemia: Secondary | ICD-10-CM | POA: Diagnosis not present

## 2021-03-30 DIAGNOSIS — I2510 Atherosclerotic heart disease of native coronary artery without angina pectoris: Secondary | ICD-10-CM

## 2021-03-30 DIAGNOSIS — I10 Essential (primary) hypertension: Secondary | ICD-10-CM | POA: Diagnosis not present

## 2021-03-30 HISTORY — DX: Atherosclerotic heart disease of native coronary artery without angina pectoris: I25.10

## 2021-03-30 NOTE — Progress Notes (Signed)
Cardiology Office Note:    Date:  03/30/2021   ID:  Debra Wolf, DOB 12/02/1970, MRN [id number]  PCP:  Marylen Ponto, MD  Cardiologist:  Garwin Brothers, MD   Referring MD: Marylen Ponto, MD    ASSESSMENT:    1. Essential hypertension   2. Mild CAD   3. Mixed hyperlipidemia   4. Morbid obesity (HCC)    PLAN:    In order of problems listed above:  Coronary artery disease: Mild in nature: Secondary prevention stressed with patient.  Importance of compliance with diet medication stressed and she vocalized understanding.  I advised her to walk to the best of her ability. Essential hypertension: Blood pressure stable and diet was emphasized.  Lifestyle modification urged. Mixed dyslipidemia and morbid obesity: Lifestyle modification risks of obesity explained to the patient and she promises to do better.  She will be back in the next few days for fasting blood work and we will check her lipids.  Her last lipid blood work revealed elevated LDL and her goal LDL must be less than 70 I discussed this with her. Patient will be seen in follow-up appointment in 12 months or earlier if the patient has any concerns    Medication Adjustments/Labs and Tests Ordered: Current medicines are reviewed at length with the patient today.  Concerns regarding medicines are outlined above.  No orders of the defined types were placed in this encounter.  No orders of the defined types were placed in this encounter.    Chief Complaint  Patient presents with   Follow-up     History of Present Illness:    Debra Wolf is a 51 y.o. female.  Patient is previously unknown to me.  She has past medical history of mild nonobstructive coronary artery disease seen on coronary angiography CT-based.  She has history of essential hypertension and dyslipidemia.  She denies any problems at this time and takes care of activities of daily living.  She leads a sedentary lifestyle because of the fact that she is post  knee replacement surgery.  At the time of my evaluation, the patient is alert awake oriented and in no distress.  Past Medical History:  Diagnosis Date   Anxiety    Asthma    Benign essential hypertension    Chest pain 07/18/2017   Last Assessment & Plan:  Formatting of this note might be different from the original. She describes a midsternal chest heaviness times 60-90 minutes that was relieved by nitroglycerin.  Troponin is negative and EKG does not suggest acute ischemic changes.  Echo from April of this year showed normal cardiac size and function without valvular abnormalities.  CTA performed in August of this year sug   Chronic pain 04/24/2014   COPD (chronic obstructive pulmonary disease) (HCC)    COPD exacerbation (HCC) 07/08/2017   Last Assessment & Plan:  Formatting of this note might be different from the original. Takes Symbicort.  Needs to quit smoking.  Continue management per primary team.   Depression 10/26/2019   Diabetes (HCC) 02/22/2017   Diabetes mellitus (HCC) 02/22/2017   Formatting of this note might be different from the original. diet controlled Formatting of this note might be different from the original. diet controlled   DOE (dyspnea on exertion) 02/28/2017   Dyslipidemia    Dyspnea and respiratory abnormality 05/30/2012   Ectopic atrial rhythm    Esophageal reflux 05/30/2012   Essential hypertension 05/30/2012   Last Assessment & Plan:  [employer]  of this note might be different from the original. Stable; continue management per primary team   GERD (gastroesophageal reflux disease)    Hypophosphatemia 10/26/2019   Hypothyroid    Hypoxia 07/08/2017   Leukocytosis 10/26/2019   Mass of ovary    Mixed hyperlipidemia 03/03/2019   Morbid obesity (HCC) 12/28/2016   Neck pain 04/27/2014   Nervousness 05/30/2012   Neuromuscular disorder (HCC)    esentrial tremors   Pain in joint involving lower leg 05/30/2012   Pain medication agreement signed 07/06/2016   Overview:   Overview:  [facility] opiod agreement reviewed, signed and copy given to patient. Overview:  [facility] opiod agreement reviewed, signed and copy given to patient.   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 Minimal hiatal hernia. Otherwise, normal EGD   PARTIAL HYSTERECTOMY     removal of deep brain stimulator  05/16/2016   Dr[facility] Dickey [facility]   TUBAL LIGATION     UPPER GASTROINTESTINAL ENDOSCOPY  2004    Current Medications: Current Meds  Medication Sig   albuterol (PROVENTIL HFA;VENTOLIN HFA) 108 (90 Base) MCG/ACT inhaler Inhale 1-2 puffs into the lungs in the morning and at bedtime.   aspirin EC 81 MG tablet Take 1 tablet (81 mg total) by mouth daily. Swallow whole.   budesonide-formoterol (SYMBICORT) 160-4.5 MCG/ACT inhaler Inhale 2 puffs into the lungs 2 (two) times daily.   bumetanide (BUMEX) 1 MG tablet Take 1 tablet (1 mg total) by mouth daily. Take 1 mg twice daily for 3 days than once daily.   citalopram (CELEXA) 20 MG tablet Take 20 mg by mouth daily.   Coenzyme Q10 10 MG capsule Take 10 mg by mouth daily.   cyclobenzaprine (FLEXERIL) 10 MG tablet Take 10 mg by mouth daily as needed for muscle spasms.   fluticasone (FLONASE) 50 MCG/ACT nasal spray Place 2 sprays into both nostrils daily.   levothyroxine (SYNTHROID) 137 MCG tablet Take 137 mcg by mouth daily.   nitroGLYCERIN (NITROSTAT) 0.4 MG SL tablet DISSOLVE 1 TABLET UNDER THE TONGUE EVERY 5 MINUTES AS NEEDED FOR CHEST PAIN. DO NOT EXCEED A TOTAL OF 3 DOSES IN 15 MINUTES.   pantoprazole (PROTONIX) 40 MG tablet Take 1 tablet (40 mg total) by mouth 2 (two) times daily.   potassium chloride  SA (KLOR-CON M) 20 MEQ tablet Take 20 mEq by mouth as needed (for potassium).   Prucalopride Succinate (MOTEGRITY) 2 MG TABS Take 1 tablet (2 mg total) by mouth daily.   rosuvastatin (CRESTOR) 5 MG tablet Take 5 mg by mouth daily.   Vitamin D, Ergocalciferol, (DRISDOL) 1.25 MG (50000 UNIT) CAPS capsule Take 1 capsule (50,000 Units total) by mouth every 7 (seven) days.     Allergies:   Penicillins, Vancomycin, and Cephalexin   Social History   Socioeconomic History   Marital status: Married    Spouse name: Not on file   Number of children: 2   Years of education: Not on file   Highest education level: Not on file  Occupational History   Occupation: disabled  Tobacco Use   Smoking status: Every Day    Packs/day: 1.00    Types:  Cigarettes   Smokeless tobacco: Never  Vaping Use   Vaping Use: Never used  Substance and Sexual Activity   Alcohol use: Not Currently   Drug use: No   Sexual activity: Not on file  Other Topics Concern   Not on file  Social History Narrative   Not on file   Social Determinants of Health   Financial Resource Strain: Not on file  Food Insecurity: Not on file  Transportation Needs: Not on file  Physical Activity: Not on file  Stress: Not on file  Social Connections: Not on file     Family History: The patient's family history includes Bladder Cancer in her mother; Cirrhosis in her maternal uncle; Congestive Heart Failure in her father and mother; Lung cancer in her father; Ovarian cancer in her sister. There is no history of Colon cancer, Liver cancer, Colon polyps, Esophageal cancer, Stomach cancer, or Rectal cancer.  ROS:   Please see the history of present illness.    All other systems reviewed and are negative.  EKGs/Labs/Other Studies Reviewed:    The following studies were reviewed today: I discussed my findings with the patient at length.   Recent Labs: 07/01/2020: BUN 8; Creatinine, Ser 0.91; Hemoglobin 12.7; Magnesium 2.0; Platelets  248; Potassium 4.1; Sodium 137  Recent Lipid Panel No results found for: CHOL, TRIG, HDL, CHOLHDL, VLDL, LDLCALC, LDLDIRECT  Physical Exam:    VS:  BP 130/70 (BP Location: Left Arm, Patient Position: Sitting, Cuff Size: Normal)   Pulse 66   Ht 5\' 8"  (1.727 m)   Wt 284 lb (128.8 kg)   LMP  (LMP Unknown) Comment: partial hysterectomy  SpO2 98%   BMI 43.18 kg/m     Wt Readings from Last 3 Encounters:  03/30/21 284 lb (128.8 kg)  07/01/20 258 lb 3.2 oz (117.1 kg)  12/24/19 252 lb 12.8 oz (114.7 kg)     GEN: Patient is in no acute distress HEENT: Normal NECK: No JVD; No carotid bruits LYMPHATICS: No lymphadenopathy CARDIAC: Hear sounds regular, 2/6 systolic murmur at the apex. RESPIRATORY:  Clear to auscultation without rales, wheezing or rhonchi  ABDOMEN: Soft, non-tender, non-distended MUSCULOSKELETAL:  No edema; No deformity  SKIN: Warm and dry NEUROLOGIC:  Alert and oriented x 3 PSYCHIATRIC:  Normal affect   Signed, Garwin Brothers, MD  03/30/2021 3:46 PM    [facility]

## 2021-03-30 NOTE — Patient Instructions (Addendum)
Medication Instructions:  ?Your physician recommends that you continue on your current medications as directed. Please refer to the Current Medication list given to you today.  ?*If you need a refill on your cardiac medications before your next appointment, please call your pharmacy* ? ? ?Lab Work: ?Your physician recommends that you return for lab work in: the next few days ?You need to have labs done when you are fasting.  You can come Monday through Friday 8:30 am to 12:00 pm and 1:15 to 4:30. You do not need to make an appointment as the order has already been placed. The labs you are going to have done are BMET, CBC, TSH, LFT and Lipids. ? ?If you have labs (blood work) drawn today and your tests are completely normal, you will receive your results only by: ?MyChart Message (if you have MyChart) OR ?A paper copy in the mail ?If you have any lab test that is abnormal or we need to change your treatment, we will call you to review the results. ? ? ?Testing/Procedures: ?None ordered ? ? ?Follow-Up: ?At [facility] you and your health needs are our priority.  As part of our continuing mission to provide you with exceptional heart care, we have created designated Provider Care Teams.  These Care Teams include your primary Cardiologist (physician) and Advanced Practice Providers (APPs -  Physician Assistants and Nurse Practitioners) who all work together to provide you with the care you need, when you need it. ? ?We recommend signing up for the patient portal called "MyChart".  Sign up information is provided on this After Visit Summary.  MyChart is used to connect with patients for Virtual Visits (Telemedicine).  Patients are able to view lab/test results, encounter notes, upcoming appointments, etc.  Non-urgent messages can be sent to your provider as well.   ?To learn more about what you can do with MyChart, go to [email or web address].   ? ?Your next appointment:   ?12 month(s) ? ?The format for your next  appointment:   ?In Person ? ?Provider:   ?Jyl Heinz, MD ? ? ?Other Instructions ? ?FDA-cleared personal EKG: The world?s most clinically validated personal EKG, FDA-cleared to detect Atrial Fibrillation, Bradycardia, and Tachycardia. Evalee Mutton is the most reliable way to check in on your heart from home. ?Take your EKG from anywhere: Capture a medical-grade EKG in 30 seconds and get an instant analysis right on your smartphone. Evalee Mutton is small enough to fit in your pocket, so you can take it with you anywhere. ?Easy to use: Simply place your fingers on the sensors--no wires, patches, or gels. ?Recommended by doctors: A trusted resource, Evalee Mutton is the #1 doctor-recommended personal EKG with more than 100 million EKGs recorded. ?Save or share your EKGs: With the press of a button, email your EKGs to your doctor or save them on your phone. ?Works with smartphones: Compatible with [employer] and tablets. Check our compatibility chart. ?FSA/HSA eligible: Purchase using an FSA or HSA account (please confirm coverage with your insurance provider). ?Phone clip included with purchase, a $15 value. Conveniently take your device with you wherever you go. ? ?https://store.BasicBling.tn ? ?Step One- Record your EKG strip on [facility] app.  ? ?Step two- On Kardia EKG click ?Download?  ? ?Step three- It will prompt you to make a password for this EKG. Please make the password ?Wolf? so that we can view it.  ? ?Step four- Click on the little ?upload? button (small box with an arrow  in the middle) in the bottom left-hand corner of the screen.  ? ?Step five- Click ?Save to Files? ? ?Step six- Click on ?On my iphone? and then ?Pages? then press save in the top right-hand corner.  ? ?NOW GO TO MYCHART  ? ?Once on MyChart click ?Messages? ? ?Step one- Click ?Send a message? ? ?Step two- Click ?Ask a medical question?  ? ?Step three- Click ?Non urgent medical question?   ? ?Step four- Click on Debra Wolf's name. ? ?Step five- Click on the small paperclip at the bottom of the screen ? ?Step six- Click ?Choose file? ? ?Step seven- Pick the most recent EKG strip listed.  ? ?Once uploaded send the message! ? ? ?

## 2021-04-01 DIAGNOSIS — E782 Mixed hyperlipidemia: Secondary | ICD-10-CM | POA: Diagnosis not present

## 2021-04-01 DIAGNOSIS — I10 Essential (primary) hypertension: Secondary | ICD-10-CM | POA: Diagnosis not present

## 2021-04-01 DIAGNOSIS — I2510 Atherosclerotic heart disease of native coronary artery without angina pectoris: Secondary | ICD-10-CM | POA: Diagnosis not present

## 2021-04-02 LAB — HEPATIC FUNCTION PANEL
ALT: 11 IU/L (ref 0–32)
AST: 13 IU/L (ref 0–40)
Albumin: 4.3 g/dL (ref 3.8–4.9)
Alkaline Phosphatase: 117 IU/L (ref 44–121)
Bilirubin Total: 0.3 mg/dL (ref 0.0–1.2)
Bilirubin, Direct: 0.11 mg/dL (ref 0.00–0.40)
Total Protein: 7 g/dL (ref 6.0–8.5)

## 2021-04-02 LAB — CBC WITH DIFFERENTIAL/PLATELET
Basophils Absolute: 0.1 10*3/uL (ref 0.0–0.2)
Basos: 1 %
EOS (ABSOLUTE): 0.2 10*3/uL (ref 0.0–0.4)
Eos: 3 %
Hematocrit: 36.8 % (ref 34.0–46.6)
Hemoglobin: 12.3 g/dL (ref 11.1–15.9)
Immature Grans (Abs): 0 10*3/uL (ref 0.0–0.1)
Immature Granulocytes: 0 %
Lymphocytes Absolute: 2.3 10*3/uL (ref 0.7–3.1)
Lymphs: 30 %
MCH: 28.7 pg (ref 26.6–33.0)
MCHC: 33.4 g/dL (ref 31.5–35.7)
MCV: 86 fL (ref 79–97)
Monocytes Absolute: 0.5 10*3/uL (ref 0.1–0.9)
Monocytes: 7 %
Neutrophils Absolute: 4.6 10*3/uL (ref 1.4–7.0)
Neutrophils: 59 %
Platelets: 240 10*3/uL (ref 150–450)
RBC: 4.28 x10E6/uL (ref 3.77–5.28)
RDW: 14.2 % (ref 11.7–15.4)
WBC: 7.7 10*3/uL (ref 3.4–10.8)

## 2021-04-02 LAB — BASIC METABOLIC PANEL
BUN/Creatinine Ratio: 13 (ref 9–23)
BUN: 11 mg/dL (ref 6–24)
CO2: 26 mmol/L (ref 20–29)
Calcium: 9.3 mg/dL (ref 8.7–10.2)
Chloride: 101 mmol/L (ref 96–106)
Creatinine, Ser: 0.85 mg/dL (ref 0.57–1.00)
Glucose: 86 mg/dL (ref 70–99)
Potassium: 4.5 mmol/L (ref 3.5–5.2)
Sodium: 138 mmol/L (ref 134–144)
eGFR: 82 mL/min/{1.73_m2} (ref >59)

## 2021-04-02 LAB — LIPID PANEL
Chol/HDL Ratio: 4.3 ratio (ref 0.0–4.4)
Cholesterol, Total: 166 mg/dL (ref 100–199)
HDL: 39 mg/dL — ABNORMAL LOW (ref >39)
LDL Chol Calc (NIH): 103 mg/dL — ABNORMAL HIGH (ref 0–99)
Triglycerides: 136 mg/dL (ref 0–149)
VLDL Cholesterol Cal: 24 mg/dL (ref 5–40)

## 2021-04-02 LAB — TSH: TSH: 0.822 u[IU]/mL (ref 0.450–4.500)

## 2021-04-04 MED ORDER — ROSUVASTATIN CALCIUM 10 MG PO TABS: 10.0000 mg | ORAL_TABLET | Freq: Every day | ORAL | 3 refills | Status: DC

## 2021-04-04 NOTE — Addendum Note (Signed)
Addended by: Truddie Hidden on: 04/04/2021 05:13 PM ? ? Modules accepted: Orders ? ?

## 2021-06-09 DIAGNOSIS — E039 Hypothyroidism, unspecified: Secondary | ICD-10-CM | POA: Diagnosis not present

## 2021-06-09 DIAGNOSIS — Z6839 Body mass index (BMI) 39.0-39.9, adult: Secondary | ICD-10-CM | POA: Diagnosis not present

## 2021-06-09 DIAGNOSIS — E119 Type 2 diabetes mellitus without complications: Secondary | ICD-10-CM | POA: Diagnosis not present

## 2021-06-09 DIAGNOSIS — R7989 Other specified abnormal findings of blood chemistry: Secondary | ICD-10-CM | POA: Diagnosis not present

## 2021-06-09 DIAGNOSIS — M2550 Pain in unspecified joint: Secondary | ICD-10-CM | POA: Diagnosis not present

## 2021-06-09 DIAGNOSIS — M109 Gout, unspecified: Secondary | ICD-10-CM | POA: Diagnosis not present

## 2021-07-04 ENCOUNTER — Telehealth: Payer: Self-pay | Admitting: Cardiology

## 2021-07-04 NOTE — Telephone Encounter (Signed)
Pt states that she has had intermittent chest pressure and shortness of breath for the past few days. Pt states she feels it could be stress but reports it happens more with activity. Pt advised to go to the ED for evaluation. Appointment has been made. Pt verbalized understanding and had no additional questions.

## 2021-07-05 ENCOUNTER — Other Ambulatory Visit: Payer: Self-pay

## 2021-07-06 ENCOUNTER — Ambulatory Visit (INDEPENDENT_AMBULATORY_CARE_PROVIDER_SITE_OTHER): Payer: BC Managed Care – PPO | Admitting: Cardiology

## 2021-07-06 ENCOUNTER — Encounter: Payer: Self-pay | Admitting: Cardiology

## 2021-07-06 VITALS — BP 148/80 | HR 76 | Ht 68.0 in | Wt 250.6 lb

## 2021-07-06 DIAGNOSIS — R9431 Abnormal electrocardiogram [ECG] [EKG]: Secondary | ICD-10-CM

## 2021-07-06 DIAGNOSIS — F17200 Nicotine dependence, unspecified, uncomplicated: Secondary | ICD-10-CM

## 2021-07-06 DIAGNOSIS — I10 Essential (primary) hypertension: Secondary | ICD-10-CM | POA: Diagnosis not present

## 2021-07-06 DIAGNOSIS — F17210 Nicotine dependence, cigarettes, uncomplicated: Secondary | ICD-10-CM | POA: Diagnosis not present

## 2021-07-06 DIAGNOSIS — I208 Other forms of angina pectoris: Secondary | ICD-10-CM

## 2021-07-06 DIAGNOSIS — E782 Mixed hyperlipidemia: Secondary | ICD-10-CM

## 2021-07-06 DIAGNOSIS — I2510 Atherosclerotic heart disease of native coronary artery without angina pectoris: Secondary | ICD-10-CM

## 2021-07-06 DIAGNOSIS — I2089 Other forms of angina pectoris: Secondary | ICD-10-CM

## 2021-07-06 DIAGNOSIS — I209 Angina pectoris, unspecified: Secondary | ICD-10-CM

## 2021-07-06 HISTORY — DX: Angina pectoris, unspecified: I20.9

## 2021-07-06 MED ORDER — METOPROLOL TARTRATE 100 MG PO TABS: 100.0000 mg | ORAL_TABLET | Freq: Once | ORAL | 0 refills | Status: DC

## 2021-07-06 NOTE — Patient Instructions (Signed)
FDA-cleared personal EKG: The world's most clinically validated personal EKG, FDA-cleared to detect Atrial Fibrillation, Bradycardia, and Tachycardia. Debra Wolf is the most reliable way to check in on your heart from home. Take your EKG from anywhere: Capture a medical-grade EKG in 30 seconds and get an instant analysis right on your smartphone. Debra Wolf is small enough to fit in your pocket, so you can take it with you anywhere. Easy to use: Simply place your fingers on the sensors--no wires, patches, or gels. Recommended by doctors: A trusted resource, Debra Wolf is the #1 doctor-recommended personal EKG with more than 100 million EKGs recorded. Save or share your EKGs: With the press of a button, email your EKGs to your doctor or save them on your phone. Works with smartphones: Compatible with [employer] and tablets. Check our compatibility chart. FSA/HSA eligible: Purchase using an FSA or HSA account (please confirm coverage with your insurance provider). Phone clip included with purchase, a $15 value. Conveniently take your device with you wherever you go.  https://[email or web address].tn   Step One- Record your EKG strip on [facility] app.   Step two- On K[facility]ia EKG click "Download"   Step three- It will prompt you to make a password for this EKG. Please make the password "Wolf" so that we can view it.   Step four- Click on the little "upload" button (small box with an arrow in the middle) in the bottom left-hand corner of the screen.   Step five- Click "Save to Files"  Step six- Click on "On my iphone" and then "Pages" then press save in the top right-hand corner.   NOW GO TO MYCHART   Once on MyChart click "Messages"  Step one- Click "Send a message"  Step two- Click "Ask a medical question"   Step three- Click "Non urgent medical question"   Step four- Click on Debra Wolf's name.  Step five- Click on the small  paperclip at the bottom of the screen  Step six- Click "Choose file"  Step seven- Pick the most recent EKG strip listed.   Once uploaded send the message!  Medication Instructions:  Your physician recommends that you continue on your current medications as directed. Please refer to the Current Medication list given to you today.   *If you need a refill on your cardiac medications before your next appointment, please call your pharmacy*   Lab Work: None ordered  If you have labs (blood work) drawn today and your tests are completely normal, you will receive your results only by: [place] (if you have MyChart) OR A paper copy in the mail If you have any lab test that is abnormal or we need to change your treatment, we will call you to review the results.   Testing/Procedures:   Your cardiac CT will be scheduled at one of the below locations:   [facility] [address] [place], [place] [address] [phone]   At [facility], please arrive at the [facility] and Children's Entrance (Entrance C2) of [facility] 30 minutes prior to test start time. You can use the FREE valet parking offered at entrance C (encouraged to control the heart rate for the test)  Proceed to the [facility] Radiology Department (first floor) to check-in and test prep.  All radiology patients and guests should use entrance C2 at [facility], accessed from [facility], even though the hospital's physical address listed is [address].  Please follow these instructions carefully (unless otherwise directed):   On the Night Before the Test: Be sure to Drink plenty of water. Do not consume any caffeinated/decaffeinated beverages or chocolate 12 hours prior to your test. Do not take any antihistamines 12 hours prior to your test.  On the Day of the Test: Drink plenty of water until 1 hour prior to the test. Do not eat any food 4  hours prior to the test. You may take your regular medications prior to the test.  Take metoprolol (Lopressor) two hours prior to test. FEMALES- please wear underwire-free bra if available, avoid dresses & tight clothing  After the Test: Drink plenty of water. After receiving IV contrast, you may experience a mild flushed feeling. This is normal. On occasion, you may experience a mild rash up to 24 hours after the test. This is not dangerous. If this occurs, you can take Benadryl 25 mg and increase your fluid intake. If you experience trouble breathing, this can be serious. If it is severe call 911 IMMEDIATELY. If it is mild, please call our office. If you take any of these medications: Glipizide/Metformin, Avandament, Glucavance, please do not take 48 hours after completing test unless otherwise instructed.  We will call to schedule your test 2-4 weeks out understanding that some insurance companies will need an authorization prior to the service being performed.   For non-scheduling related questions, please contact the cardiac imaging nurse navigator should you have any questions/concerns: Marchia Bond, Cardiac Imaging Nurse Navigator Gordy Clement, Cardiac Imaging Nurse Navigator [facility] Direct Office Dial: [phone]   For scheduling needs, including cancellations and rescheduling, please call [place], [phone].    Follow-Up: At [facility], you and your health needs are our priority.  As part of our continuing mission to provide you with exceptional heart care, we have created designated Provider Care Teams.  These Care Teams include your primary Cardiologist (physician) and Advanced Practice Providers (APPs -  Physician Assistants and Nurse Practitioners) who all work together to provide you with the care you need, when you need it.  We recommend signing up for the patient portal called "MyChart".  Sign up information is provided on this  After Visit Summary.  MyChart is used to connect with patients for Virtual Visits (Telemedicine).  Patients are able to view lab/test results, encounter notes, upcoming appointments, etc.  Non-urgent messages can be sent to your provider as well.   To learn more about what you can do with MyChart, go to [email or web address].    Your next appointment:   9 month(s)  The format for your next appointment:   In Person  Provider:   Jyl Heinz, MD   Other Instructions Cardiac CT Angiogram A cardiac CT angiogram is a procedure to look at the heart and the area around the heart. It may be done to help find the cause of chest pains or other symptoms of heart disease. During this procedure, a substance called contrast dye is injected into the blood vessels in the area to be checked. A large X-ray machine, called a CT scanner, then takes detailed pictures of the heart and the surrounding area. The procedure is also sometimes called a coronary CT angiogram, coronary artery scanning, or CTA. A cardiac CT angiogram allows the health care provider to see how well blood is flowing to and from the heart. The health care provider will be able to see if there are any problems, such as: Blockage or  narrowing of the coronary arteries in the heart. Fluid around the heart. Signs of weakness or disease in the muscles, valves, and tissues of the heart. Tell a health care provider about: Any allergies you have. This is especially important if you have had a previous allergic reaction to contrast dye. All medicines you are taking, including vitamins, herbs, eye drops, creams, and over-the-counter medicines. Any blood disorders you have. Any surgeries you have had. Any medical conditions you have. Whether you are pregnant or may be pregnant. Any anxiety disorders, chronic pain, or other conditions you have that may increase your stress or prevent you from lying still. What are the risks? Generally, this is a  safe procedure. However, problems may occur, including: Bleeding. Infection. Allergic reactions to medicines or dyes. Damage to other structures or organs. Kidney damage from the contrast dye that is used. Increased risk of cancer from radiation exposure. This risk is low. Talk with your health care provider about: The risks and benefits of testing. How you can receive the lowest dose of radiation. What happens before the procedure? Wear comfortable clothing and remove any jewelry, glasses, dentures, and hearing aids. Follow instructions from your health care provider about eating and drinking. This may include: For 12 hours before the procedure -- avoid caffeine. This includes tea, coffee, soda, energy drinks, and diet pills. Drink plenty of water or other fluids that do not have caffeine in them. Being well hydrated can prevent complications. For 4-6 hours before the procedure -- stop eating and drinking. The contrast dye can cause nausea, but this is less likely if your stomach is empty. Ask your health care provider about changing or stopping your regular medicines. This is especially important if you are taking diabetes medicines, blood thinners, or medicines to treat problems with erections (erectile dysfunction). What happens during the procedure?  Hair on your chest may need to be removed so that small sticky patches called electrodes can be placed on your chest. These will transmit information that helps to monitor your heart during the procedure. An IV will be inserted into one of your veins. You might be given a medicine to control your heart rate during the procedure. This will help to ensure that good images are obtained. You will be asked to lie on an exam table. This table will slide in and out of the CT machine during the procedure. Contrast dye will be injected into the IV. You might feel warm, or you may get a metallic taste in your mouth. You will be given a medicine called  nitroglycerin. This will relax or dilate the arteries in your heart. The table that you are lying on will move into the CT machine tunnel for the scan. The person running the machine will give you instructions while the scans are being done. You may be asked to: Keep your arms above your head. Hold your breath. Stay very still, even if the table is moving. When the scanning is complete, you will be moved out of the machine. The IV will be removed. The procedure may vary among health care providers and hospitals. What can I expect after the procedure? After your procedure, it is common to have: A metallic taste in your mouth from the contrast dye. A feeling of warmth. A headache from the nitroglycerin. Follow these instructions at home: Take over-the-counter and prescription medicines only as told by your health care provider. If you are told, drink enough fluid to keep your urine pale yellow. This  will help to flush the contrast dye out of your body. Most people can return to their normal activities right after the procedure. Ask your health care provider what activities are safe for you. It is up to you to get the results of your procedure. Ask your health care provider, or the department that is doing the procedure, when your results will be ready. Keep all follow-up visits as told by your health care provider. This is important. Contact a health care provider if: You have any symptoms of allergy to the contrast dye. These include: Shortness of breath. Rash or hives. A racing heartbeat. Summary A cardiac CT angiogram is a procedure to look at the heart and the area around the heart. It may be done to help find the cause of chest pains or other symptoms of heart disease. During this procedure, a large X-ray machine, called a CT scanner, takes detailed pictures of the heart and the surrounding area after a contrast dye has been injected into blood vessels in the area. Ask your health care  provider about changing or stopping your regular medicines before the procedure. This is especially important if you are taking diabetes medicines, blood thinners, or medicines to treat erectile dysfunction. If you are told, drink enough fluid to keep your urine pale yellow. This will help to flush the contrast dye out of your body. This information is not intended to replace advice given to you by your health care provider. Make sure you discuss any questions you have with your health care provider. Document Revised: 08/21/2018 Document Reviewed: 08/21/2018 Elsevier Patient Education  [place].   Echocardiogram An echocardiogram is a test that uses sound waves (ultrasound) to produce images of the heart. Images from an echocardiogram can provide important information about: Heart size and shape. The size and thickness and movement of your heart's walls. Heart muscle function and strength. Heart valve function or if you have stenosis. Stenosis is when the heart valves are too narrow. If blood is flowing backward through the heart valves (regurgitation). A tumor or infectious growth around the heart valves. Areas of heart muscle that are not working well because of poor blood flow or injury from a heart attack. Aneurysm detection. An aneurysm is a weak or damaged part of an artery wall. The wall bulges out from the normal force of blood pumping through the body. Tell a health care provider about: Any allergies you have. All medicines you are taking, including vitamins, herbs, eye drops, creams, and over-the-counter medicines. Any blood disorders you have. Any surgeries you have had. Any medical conditions you have. Whether you are pregnant or may be pregnant. What are the risks? Generally, this is a safe test. However, problems may occur, including an allergic reaction to dye (contrast) that may be used during the test. What happens before the test? No specific preparation is  needed. You may eat and drink normally. What happens during the test?  You will take off your clothes from the waist up and put on a hospital gown. Electrodes or electrocardiogram (ECG)patches may be placed on your chest. The electrodes or patches are then connected to a device that monitors your heart rate and rhythm. You will lie down on a table for an ultrasound exam. A gel will be applied to your chest to help sound waves pass through your skin. A handheld device, called a transducer, will be pressed against your chest and moved over your heart. The transducer produces sound waves that  travel to your heart and bounce back (or "echo" back) to the transducer. These sound waves will be captured in real-time and changed into images of your heart that can be viewed on a video monitor. The images will be recorded on a computer and reviewed by your health care provider. You may be asked to change positions or hold your breath for a short time. This makes it easier to get different views or better views of your heart. In some cases, you may receive contrast through an IV in one of your veins. This can improve the quality of the pictures from your heart. The procedure may vary among health care providers and hospitals. What can I expect after the test? You may return to your normal, everyday life, including diet, activities, and medicines, unless your health care provider tells you not to do that. Follow these instructions at home: It is up to you to get the results of your test. Ask your health care provider, or the department that is doing the test, when your results will be ready. Keep all follow-up visits. This is important. Summary An echocardiogram is a test that uses sound waves (ultrasound) to produce images of the heart. Images from an echocardiogram can provide important information about the size and shape of your heart, heart muscle function, heart valve function, and other possible heart  problems. You do not need to do anything to prepare before this test. You may eat and drink normally. After the echocardiogram is completed, you may return to your normal, everyday life, unless your health care provider tells you not to do that. This information is not intended to replace advice given to you by your health care provider. Make sure you discuss any questions you have with your health care provider. Document Revised: 09/08/2020 Document Reviewed: 08/19/2019 Elsevier Patient Education  [place].

## 2021-07-06 NOTE — Progress Notes (Signed)
Cardiology Office Note:    Date:  07/06/2021   ID:  Debra Wolf, DOB 10-10-1970, MRN [id number]  PCP:  Ronita Hipps, MD  Cardiologist:  Jenean Lindau, MD   Referring MD: Ronita Hipps, MD    ASSESSMENT:    1. Essential hypertension   2. Mixed hyperlipidemia   3. Mild CAD   4. Morbid obesity ([place])   5. Tobacco use disorder   6. Angina pectoris ([place])    PLAN:    In order of problems listed above:  Angina pectoris: Secondary prevention stressed with the patient.  Importance of compliance with diet medication stressed and she vocalized understanding.  Multiple modalities of evaluation invasive, noninvasive and CT coronary angiography was discussed and she prefers the latter.  We will set her up for this.  Nitroglycerin protocol was explained and she agrees to go to to the nearest emergency room for any significant concerns. Essential hypertension: Blood pressure stable and diet was emphasized lifestyle modification was urged. Mixed dyslipidemia: Lipids were reviewed diet was emphasized and she promises to do better.  We will evaluate her with CT coronary angiography and then uptitrate her medications as necessary. Obesity: Weight reduction stressed risks of obesity.  And she promises to do better. Cigarette smoker: I spent 5 minutes with the patient discussing solely about smoking. Smoking cessation was counseled. I suggested to the patient also different medications and pharmacological interventions. Patient is keen to try stopping on its own at this time. He will get back to me if he needs any further assistance in this matter. Patient will be seen in follow-up appointment in 6 months or earlier if the patient has any concerns   Medication Adjustments/Labs and Tests Ordered: Current medicines are reviewed at length with the patient today.  Concerns regarding medicines are outlined above.  No orders of the defined types were placed in this encounter.  No orders of the defined  types were placed in this encounter.    No chief complaint on file.    History of Present Illness:    Debra Wolf is a 51 y.o. female.  Patient has past medical history of nonobstructive coronary artery disease, essential hypertension, mixed dyslipidemia, obesity and unfortunately continues to smoke.  She mentions to me that she has chest tightness at times and has used nitroglycerin with relief.  She is concerned about it.  At the time of my evaluation, the patient is alert awake oriented and in no distress.  Past Medical History:  Diagnosis Date   Anxiety    Asthma    Benign essential hypertension    Chest pain 07/18/2017   Last Assessment & Plan:  Formatting of this note might be different from the original. She describes a midsternal chest heaviness times 60-90 minutes that was relieved by nitroglycerin.  Troponin is negative and EKG does not suggest acute ischemic changes.  Echo from April of this year showed normal cardiac size and function without valvular abnormalities.  CTA performed in August of this year sug   Chronic pain 04/24/2014   COPD (chronic obstructive pulmonary disease) (HCC)    COPD exacerbation ([place]) 07/08/2017   Last Assessment & Plan:  Formatting of this note might be different from the original. Takes Symbicort.  Needs to quit smoking.  Continue management per primary team.   Depression 10/26/2019   Diabetes ([place]) 02/22/2017   Diabetes mellitus ([place]) 02/22/2017   Formatting of this note might be different from the original. diet controlled Formatting of  this note might be different from the original. diet controlled   DOE (dyspnea on exertion) 02/28/2017   Dyslipidemia    Dyspnea and respiratory abnormality 05/30/2012   Ectopic atrial rhythm    Esophageal reflux 05/30/2012   Essential hypertension 05/30/2012   Last Assessment & Plan:  Formatting of this note might be different from the original. Stable; continue management per primary team   GERD (gastroesophageal  reflux disease)    Hypophosphatemia 10/26/2019   Hypothyroid    Hypoxia 07/08/2017   Leukocytosis 10/26/2019   Mass of ovary    Mild CAD 03/30/2021   Mixed hyperlipidemia 03/03/2019   Morbid obesity ([place]) 12/28/2016   Neck pain 04/27/2014   Nervousness 05/30/2012   Neuromuscular disorder ([place])    esentrial tremors   Pain in joint involving lower leg 05/30/2012   Pain medication agreement signed 07/06/2016   Overview:  Overview:  [facility] opiod agreement reviewed, signed and copy given to patient. Overview:  [facility] opiod agreement reviewed, signed and copy given to patient.   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 Minimal hiatal hernia. Otherwise, normal EGD   PARTIAL HYSTERECTOMY     removal of deep brain stimulator  05/16/2016   Dr. Phylliss Blakes at [facility]  2004    Current Medications: Current Meds  Medication Sig   albuterol (PROVENTIL HFA;VENTOLIN HFA) 108 (90 Base) MCG/ACT inhaler Inhale 1-2 puffs into the lungs in the morning and at bedtime.   allopurinol (ZYLOPRIM) 100 MG tablet Take 100 mg by mouth daily.   aspirin EC 81 MG tablet Take 1 tablet (81 mg total) by mouth daily. Swallow whole.   budesonide-formoterol (SYMBICORT) 160-4.5 MCG/ACT inhaler Inhale 2 puffs into the lungs 2 (two) times daily.   bumetanide (BUMEX) 1 MG tablet Take 1 tablet (1 mg total) by mouth daily. Take 1 mg twice daily for 3 days than once daily.   celecoxib (CELEBREX) 200 MG capsule Take 200 mg by mouth daily.   citalopram (CELEXA) 20 MG tablet Take 20 mg by mouth daily.   Coenzyme Q10 10 MG capsule Take 10 mg  by mouth daily.   cyclobenzaprine (FLEXERIL) 10 MG tablet Take 10 mg by mouth daily as needed for muscle spasms.   fluticasone (FLONASE) 50 MCG/ACT nasal spray Place 2 sprays into both nostrils daily.   levothyroxine (SYNTHROID) 137 MCG tablet Take 137 mcg by mouth daily.   lisinopril (ZESTRIL) 5 MG tablet Take 5 mg by mouth daily.   nitroGLYCERIN (NITROSTAT) 0.4 MG SL tablet DISSOLVE 1 TABLET UNDER THE TONGUE EVERY 5 MINUTES AS NEEDED FOR CHEST PAIN. DO NOT EXCEED A TOTAL OF 3 DOSES IN 15 MINUTES.   pantoprazole (PROTONIX) 40 MG tablet Take 1 tablet (40 mg total) by mouth 2 (two) times daily.   potassium chloride SA (KLOR-CON M) 20 MEQ tablet Take 20 mEq by mouth as needed (for potassium).   Prucalopride Succinate (MOTEGRITY) 2 MG TABS Take 1 tablet (2 mg total) by mouth daily.   rosuvastatin (CRESTOR) 10 MG tablet Take 1 tablet (10 mg total) by mouth daily.   Semaglutide ([place],  0.25 OR 0.5 MG/DOSE, [place]) Inject 0.25 mg into the skin once a week.   Vitamin D, Ergocalciferol, (DRISDOL) 1.25 MG (50000 UNIT) CAPS capsule Take 1 capsule (50,000 Units total) by mouth every 7 (seven) days.     Allergies:   Penicillins, Vancomycin, and Cephalexin   Social History   Socioeconomic History   Marital status: Married    Spouse name: Not on file   Number of children: 2   Years of education: Not on file   Highest education level: Not on file  Occupational History   Occupation: disabled  Tobacco Use   Smoking status: Every Day    Packs/day: 1.00    Types: Cigarettes   Smokeless tobacco: Never  Vaping Use   Vaping Use: Never used  Substance and Sexual Activity   Alcohol use: Not Currently   Drug use: No   Sexual activity: Not on file  Other Topics Concern   Not on file  Social History Narrative   Not on file   Social Determinants of Health   Financial Resource Strain: Not on file  Food Insecurity: Not on file  Transportation Needs: Not on file  Physical Activity: Not on file   Stress: Not on file  Social Connections: Not on file     Family History: The patient's family history includes Bladder Cancer in her mother; Cirrhosis in her maternal uncle; Congestive Heart Failure in her father and mother; Lung cancer in her father; Ovarian cancer in her sister. There is no history of Colon cancer, Liver cancer, Colon polyps, Esophageal cancer, Stomach cancer, or Rectal cancer.  ROS:   Please see the history of present illness.    All other systems reviewed and are negative.  EKGs/Labs/Other Studies Reviewed:    The following studies were reviewed today: EKG reveals sinus rhythm nonspecific ST-T changes   Recent Labs: 04/01/2021: ALT 11; BUN 11; Creatinine, Ser 0.85; Hemoglobin 12.3; Platelets 240; Potassium 4.5; Sodium 138; TSH 0.822  Recent Lipid Panel    Component Value Date/Time   CHOL 166 04/01/2021 1018   TRIG 136 04/01/2021 1018   HDL 39 (L) 04/01/2021 1018   CHOLHDL 4.3 04/01/2021 1018   LDLCALC 103 (H) 04/01/2021 1018    Physical Exam:    VS:  BP (!) 148/80   Pulse 76   Ht '5\' 8"'$  (1.727 m)   Wt 250 lb 9.6 oz (113.7 kg)   LMP  (LMP Unknown) Comment: partial hysterectomy  SpO2 99%   BMI 38.10 kg/m     Wt Readings from Last 3 Encounters:  07/06/21 250 lb 9.6 oz (113.7 kg)  03/30/21 248 lb (112.5 kg)  07/01/20 258 lb 3.2 oz (117.1 kg)     GEN: Patient is in no acute distress HEENT: Normal NECK: No JVD; No carotid bruits LYMPHATICS: No lymphadenopathy CARDIAC: Hear sounds regular, 2/6 systolic murmur at the apex. RESPIRATORY:  Clear to auscultation without rales, wheezing or rhonchi  ABDOMEN: Soft, non-tender, non-distended MUSCULOSKELETAL:  No edema; No deformity  SKIN: Warm and dry NEUROLOGIC:  Alert and oriented x 3 PSYCHIATRIC:  Normal affect   Signed, Jenean Lindau, MD  07/06/2021 1:16 PM    [facility]

## 2021-07-07 ENCOUNTER — Telehealth (HOSPITAL_COMMUNITY): Payer: Self-pay | Admitting: Emergency Medicine

## 2021-07-07 ENCOUNTER — Telehealth: Payer: Self-pay | Admitting: Cardiology

## 2021-07-07 NOTE — Telephone Encounter (Signed)
Left VM for pt to callback and detailed message regarding times for labs.

## 2021-07-07 NOTE — Telephone Encounter (Signed)
Reaching out to patient to offer assistance regarding upcoming cardiac imaging study; pt verbalizes understanding of appt date/time, parking situation and where to check in, pre-test NPO status and medications ordered, and verified current allergies; name and call back number provided for further questions should they arise Marchia Bond RN Navigator Cardiac Imaging [facility] [phone] office [phone] cell  Pt reports she cannot afford the test ($660.13) after ins. She talked with the billing dept for payment plan options. She will call back to confirm appt or r/s, cancel appt.  Pt reminded to get labs today if she proceeds with test.  '100mg'$  metoprolol tartrate  Arrival 230

## 2021-07-07 NOTE — Addendum Note (Signed)
Addended by: Truddie Hidden on: 07/07/2021 10:25 AM   Modules accepted: Orders

## 2021-07-07 NOTE — Telephone Encounter (Signed)
Patient called in to say that she needs orders put in, so that she can get done before her CT on tomorrow. Please advise

## 2021-07-08 ENCOUNTER — Ambulatory Visit (HOSPITAL_COMMUNITY)
Admission: RE | Admit: 2021-07-08 | Discharge: 2021-07-08 | Disposition: A | Discharge status: Home / Self Care | Payer: BC Managed Care – PPO | Source: Ambulatory Visit | Attending: Cardiology | Admitting: Cardiology

## 2021-07-08 DIAGNOSIS — I208 Other forms of angina pectoris: Secondary | ICD-10-CM | POA: Insufficient documentation

## 2021-07-08 MED ORDER — IOHEXOL 350 MG/ML SOLN
100.0000 mL | Freq: Once | INTRAVENOUS | Status: AC | PRN
  Administered 2021-07-08: 100 mL via INTRAVENOUS

## 2021-07-08 MED ORDER — NITROGLYCERIN 0.4 MG SL SUBL
0.8000 mg | SUBLINGUAL_TABLET | Freq: Once | SUBLINGUAL | Status: AC
  Administered 2021-07-08: 0.8 mg via SUBLINGUAL

## 2021-07-08 MED ORDER — NITROGLYCERIN 0.4 MG SL SUBL
SUBLINGUAL_TABLET | SUBLINGUAL | Status: AC
  Filled 2021-07-08: qty 2

## 2021-07-11 ENCOUNTER — Encounter: Payer: Self-pay | Admitting: Cardiology

## 2021-07-11 ENCOUNTER — Telehealth: Payer: Self-pay

## 2021-07-11 ENCOUNTER — Telehealth: Payer: Self-pay | Admitting: Cardiology

## 2021-07-11 DIAGNOSIS — I2510 Atherosclerotic heart disease of native coronary artery without angina pectoris: Secondary | ICD-10-CM

## 2021-07-11 DIAGNOSIS — E782 Mixed hyperlipidemia: Secondary | ICD-10-CM

## 2021-07-11 DIAGNOSIS — R1319 Other dysphagia: Secondary | ICD-10-CM

## 2021-07-11 MED ORDER — NITROGLYCERIN 0.4 MG SL SUBL: SUBLINGUAL_TABLET | SUBLINGUAL | 6 refills | Status: DC

## 2021-07-11 MED ORDER — ROSUVASTATIN CALCIUM 20 MG PO TABS: 20.0000 mg | ORAL_TABLET | Freq: Every day | ORAL | 3 refills | Status: DC

## 2021-07-11 NOTE — Telephone Encounter (Signed)
Spoke with pt about her CT results and Dr. Julien Nordmann recommendations. She stated that she had used Diltiazem in the past and it seemed to help and asked if she should maybe try this again. Message sent to Dr. Geraldo Pitter. Sent Crestor '20mg'$  and Nitroglycerin to the pharmacy.

## 2021-07-11 NOTE — Telephone Encounter (Signed)
Patient calling for CT scan. She wants to know if there are any new medications she would need, because her pharmacy will be closed tomorrow.

## 2021-07-11 NOTE — Telephone Encounter (Signed)
While calling patient this morning to remind her of her echo Wednesday she asked if she would still need to come to get her echo since she hasn't gotten her CT results back yet. CB # [phone]

## 2021-07-13 ENCOUNTER — Other Ambulatory Visit: Payer: BC Managed Care – PPO

## 2021-07-13 NOTE — Telephone Encounter (Signed)
Spoke with pt and gave results of CT scan. Pt states she would like to see if there is something else she can take instead of NTG. Pt states it gets worse when she gets busy at work. Pt states that her heart rate increases and she starts having chest pain. Advised to see her PCP as it could be anxiety. Please advise.  Chest CT 07/08/21:  . Coronary calcium score of 28.2. This was 9 percentile for age and sex matched control.   2. Normal coronary origin with right dominance.   3. CAD-RADS 2. mild non-obstructive CAD (25-49%) - mid LAD. Consider non-atherosclerotic causes of chest pain. Consider preventive therapy and risk factor modification.  Pt had labs recently done at PCP and are scanned in the lab section.

## 2022-01-10 DIAGNOSIS — Z6838 Body mass index (BMI) 38.0-38.9, adult: Secondary | ICD-10-CM | POA: Diagnosis not present

## 2022-01-10 DIAGNOSIS — J329 Chronic sinusitis, unspecified: Secondary | ICD-10-CM | POA: Diagnosis not present

## 2022-02-08 IMAGING — CT CT HEART MORP W/ CTA COR W/ SCORE W/ CA W/CM &/OR W/O CM
4 of 7 series · 8 of 20 positions shown, 9 images · IV contrast (APPLIED)
Comparison: None.
COMPARISON: None.

Addendum:
EXAM:
OVER-READ INTERPRETATION  CT CHEST

The following report is an over-read performed by radiologist Dr.
Ubah Lesperance [REDACTED] on 09/05/2019. This over-read
does not include interpretation of cardiac or coronary anatomy or
pathology. The coronary CTA interpretation by the cardiologist is
attached.
HISTORY: Chest pain/anginal equiv, ECGs and troponins normal
Cardiac/Coronary  CT
TECHNIQUE: The patient was scanned on a Siemens Force scanner.
PROTOCOL: A 120 kV prospective scan was triggered in the descending thoracic
aorta at 111 HU's. Axial non-contrast 3 mm slices were carried out
through the heart. The data set was analyzed on a dedicated work
station and scored using the Agatson method. Gantry rotation speed
was 250 msecs and collimation was .6 mm. Beta blockade and 0.8 mg of
sl NTG was given. The 3D data set was reconstructed in 5% intervals
of the 67-82 % of the R-R cycle. Diastolic phases were analyzed on a
dedicated work station using MPR, MIP and VRT modes. The patient
received 80mL OMNIPAQUE IOHEXOL 350 MG/ML SOLN of contrast.

[Series 6: best diast 76 % · axial · 0.38mm/px · z∈[-159,-118]mm · 2 of 303 slices shown, 3 images]
[im 101/303  vessel]
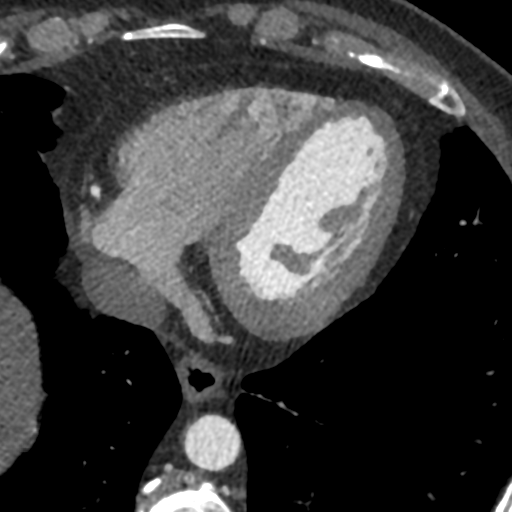
[im 101/303  lung]
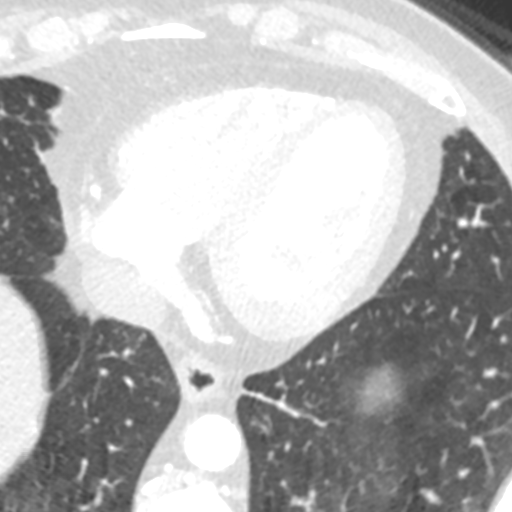
[im 202/303  vessel]
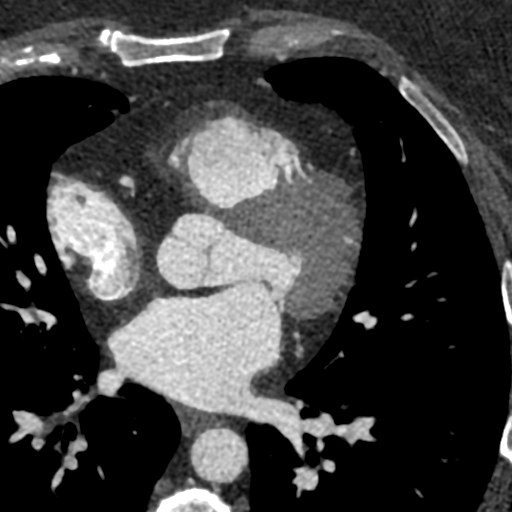

[Series 7: best syst 42 % · axial · 0.38mm/px · z∈[-159,-118]mm · 2 of 303 slices shown]
[im 101/303  vessel]
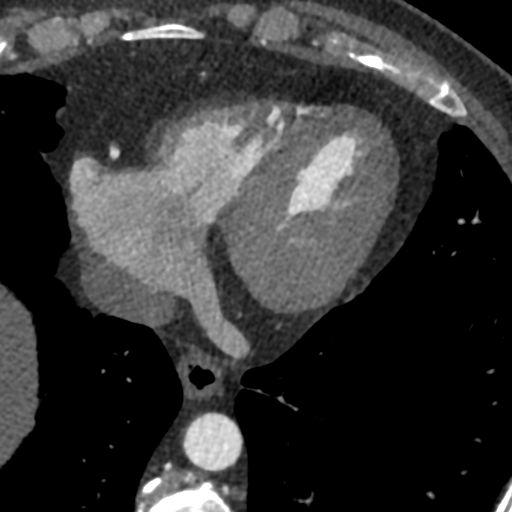
[im 202/303  vessel]
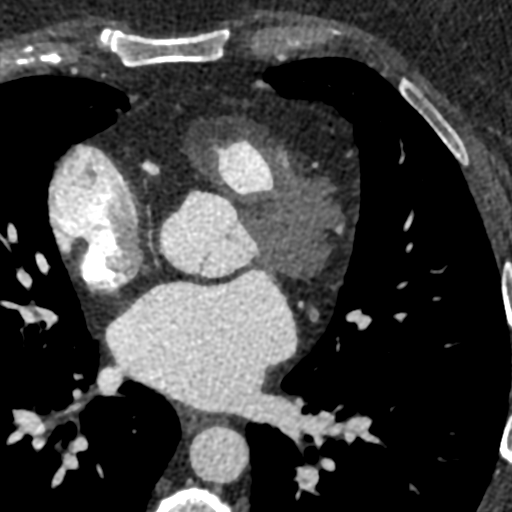

[Series 9: ts diast sharp 76 % · axial · 0.38mm/px · z∈[-159,-118]mm · 2 of 303 slices shown]
[im 101/303  lung]
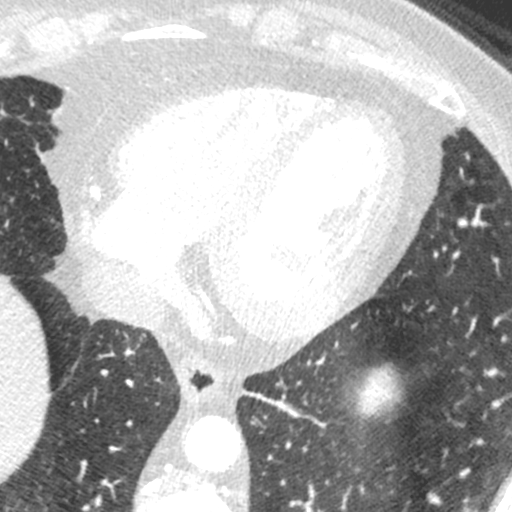
[im 202/303  lung]
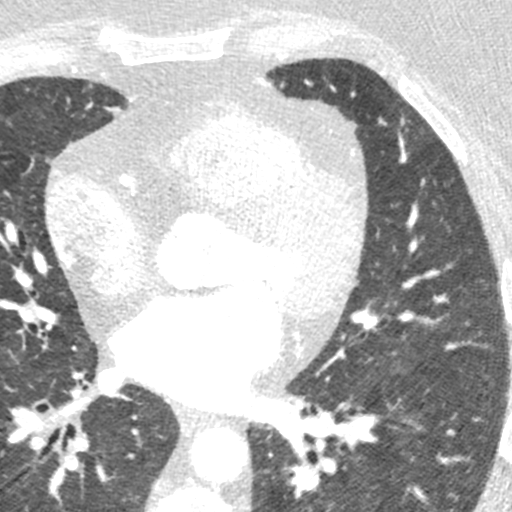

[Series 10: ts syst sharp 42 % · axial · 0.38mm/px · z∈[-159,-118]mm · 2 of 303 slices shown]
[im 101/303  lung]
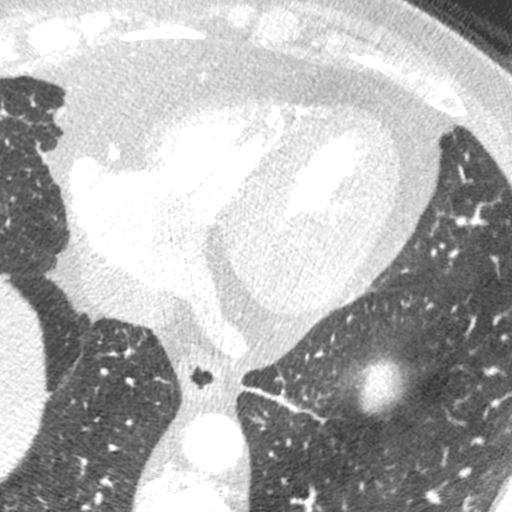
[im 202/303  lung]
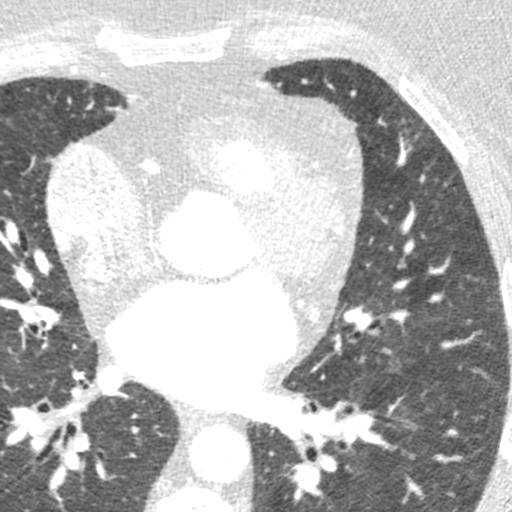

[8 of 20 positions shown; findings below may reference images not displayed]

FINDINGS: Vascular: Heart is normal size.  Aorta normal caliber.

Mediastinum/Nodes: No adenopathy

Lungs/Pleura: No confluent opacities or effusions.

Upper Abdomen: Imaging into the upper abdomen demonstrates no acute
findings.

Musculoskeletal: Chest wall soft tissues are unremarkable. No acute
bony abnormality.
IMPRESSION: No acute or significant extracardiac abnormality.
FINDINGS: Coronary calcium score is 0.

Coronary arteries: Normal coronary origins.  Right dominance.

Right Coronary Artery: Mild atherosclerotic plaque in the mid RCA,
25-49% stenosis.

Left Main Coronary Artery: No detectable plaque or stenosis. LM
trifurcates into LAD, ramus intermedius, and left circumflex
arteries.

Left Anterior Descending Coronary Artery: Minimal atherosclerotic
plaque in the mid LAD, <25% stenosis.

Ramus intermedius: Minimal atherosclerotic plaque in proximal RI,
<25% stenosis.

Left Circumflex Artery: No detectable plaque or stenosis.

Aorta: Normal size, 30 mm at the mid ascending aorta (level of the
PA bifurcation) measured double oblique. No calcifications. No
dissection.

Aortic Valve: Trivial annular calcifications.

Other findings:

Normal pulmonary vein drainage into the left atrium.

Normal left atrial appendage without a thrombus.

Mild dilation of main pulmonary artery, 30 mm.
IMPRESSION: 1. Mild CAD in mid RCA, CADRADS = 2.

2. Coronary calcium score of 0.

3. Normal coronary origin with right dominance.

4. Mild dilation of main pulmonary artery, 30 mm.

*** End of Addendum ***
EXAM:
OVER-READ INTERPRETATION  CT CHEST

The following report is an over-read performed by radiologist Dr.
Ubah Lesperance [REDACTED] on 09/05/2019. This over-read
does not include interpretation of cardiac or coronary anatomy or
pathology. The coronary CTA interpretation by the cardiologist is
attached.
FINDINGS: Vascular: Heart is normal size.  Aorta normal caliber.

Mediastinum/Nodes: No adenopathy

Lungs/Pleura: No confluent opacities or effusions.

Upper Abdomen: Imaging into the upper abdomen demonstrates no acute
findings.

Musculoskeletal: Chest wall soft tissues are unremarkable. No acute
bony abnormality.
IMPRESSION: No acute or significant extracardiac abnormality.

## 2022-03-17 DIAGNOSIS — E559 Vitamin D deficiency, unspecified: Secondary | ICD-10-CM | POA: Diagnosis not present

## 2022-03-17 DIAGNOSIS — M109 Gout, unspecified: Secondary | ICD-10-CM | POA: Diagnosis not present

## 2022-03-17 DIAGNOSIS — E039 Hypothyroidism, unspecified: Secondary | ICD-10-CM | POA: Diagnosis not present

## 2022-03-17 DIAGNOSIS — Z6838 Body mass index (BMI) 38.0-38.9, adult: Secondary | ICD-10-CM | POA: Diagnosis not present

## 2022-03-17 DIAGNOSIS — E119 Type 2 diabetes mellitus without complications: Secondary | ICD-10-CM | POA: Diagnosis not present

## 2022-03-17 LAB — LAB REPORT - SCANNED
A1c: 5.4
EGFR: 60

## 2022-05-02 ENCOUNTER — Other Ambulatory Visit: Payer: Self-pay

## 2022-05-03 ENCOUNTER — Ambulatory Visit: Payer: BC Managed Care – PPO | Admitting: Cardiology

## 2022-05-31 DIAGNOSIS — L03115 Cellulitis of right lower limb: Secondary | ICD-10-CM | POA: Diagnosis not present

## 2022-05-31 DIAGNOSIS — Z23 Encounter for immunization: Secondary | ICD-10-CM | POA: Diagnosis not present

## 2022-05-31 DIAGNOSIS — S91032A Puncture wound without foreign body, left ankle, initial encounter: Secondary | ICD-10-CM | POA: Diagnosis not present

## 2022-07-03 ENCOUNTER — Ambulatory Visit (INDEPENDENT_AMBULATORY_CARE_PROVIDER_SITE_OTHER): Payer: BC Managed Care – PPO

## 2022-07-03 ENCOUNTER — Ambulatory Visit (INDEPENDENT_AMBULATORY_CARE_PROVIDER_SITE_OTHER): Payer: BC Managed Care – PPO | Admitting: Podiatry

## 2022-07-03 DIAGNOSIS — M6528 Calcific tendinitis, other site: Secondary | ICD-10-CM

## 2022-07-03 DIAGNOSIS — M7750 Other enthesopathy of unspecified foot: Secondary | ICD-10-CM

## 2022-07-03 DIAGNOSIS — M778 Other enthesopathies, not elsewhere classified: Secondary | ICD-10-CM

## 2022-07-03 DIAGNOSIS — E1142 Type 2 diabetes mellitus with diabetic polyneuropathy: Secondary | ICD-10-CM | POA: Diagnosis not present

## 2022-07-03 MED ORDER — METHYLPREDNISOLONE 4 MG PO TBPK: ORAL_TABLET | ORAL | 0 refills | Status: DC

## 2022-07-03 MED ORDER — MELOXICAM 15 MG PO TABS: 15.0000 mg | ORAL_TABLET | Freq: Every day | ORAL | 0 refills | Status: AC

## 2022-07-03 MED ORDER — GABAPENTIN 300 MG PO CAPS: 300.0000 mg | ORAL_CAPSULE | Freq: Three times a day (TID) | ORAL | 3 refills | Status: AC

## 2022-07-03 NOTE — Progress Notes (Signed)
Subjective:  Patient ID: Debra Wolf, female    DOB: 1970-03-18,  MRN: [id number]  Chief Complaint  Patient presents with   Foot Problem    Bilateral foot pain to entire bottom of feet. C/o cramping to the top of right foot and burning sensation to the top of left foot. Patient has a knot to the back of right heel.     52 y.o. female presents with concern for pain in her right posterior heel.  She has pain at the back of the heel that is worsened with walking.  She is only able to wear open back shoes.  Feels like a knot is at the back of the heel.  Additionally she has some burning tingling and pins and needle sensation in the left foot and ankle area.  Feels like the pain radiates up into her leg.  Does have a history of type 2 diabetes not currently taking any medications for it.  Past Medical History:  Diagnosis Date   Angina pectoris (HCC) 07/06/2021   Anxiety    Asthma    Benign essential hypertension    Chest pain 07/18/2017   Last Assessment & Plan:  Formatting of this note might be different from the original. She describes a midsternal chest heaviness times 60-90 minutes that was relieved by nitroglycerin.  Troponin is negative and EKG does not suggest acute ischemic changes.  Echo from April of this year showed normal cardiac size and function without valvular abnormalities.  CTA performed in August of this year sug   Chronic pain 04/24/2014   COPD (chronic obstructive pulmonary disease) (HCC)    COPD exacerbation (HCC) 07/08/2017   Last Assessment & Plan:  Formatting of this note might be different from the original. Takes Symbicort.  Needs to quit smoking.  Continue management per primary team.   Depression 10/26/2019   Diabetes (HCC) 02/22/2017   Diabetes mellitus (HCC) 02/22/2017   Formatting of this note might be different from the original. diet controlled Formatting of this note might be different from the original. diet controlled   DOE (dyspnea on exertion) 02/28/2017    Dyslipidemia    Dyspnea and respiratory abnormality 05/30/2012   Ectopic atrial rhythm    Esophageal reflux 05/30/2012   Essential hypertension 05/30/2012   Last Assessment & Plan:  Formatting of this note might be different from the original. Stable; continue management per primary team   GERD (gastroesophageal reflux disease)    Hypophosphatemia 10/26/2019   Hypothyroid    Hypoxia 07/08/2017   Leukocytosis 10/26/2019   Mass of ovary    Mild CAD 03/30/2021   Mixed hyperlipidemia 03/03/2019   Morbid obesity (HCC) 12/28/2016   Neck pain 04/27/2014   Nervousness 05/30/2012   Neuromuscular disorder (HCC)    esentrial tremors   Pain in joint involving lower leg 05/30/2012   Pain medication agreement signed 07/06/2016   Overview:  Overview:  [facility] opiod agreement reviewed, signed and copy given to patient. Overview:  [facility] opiod agreement reviewed, signed and copy given to patient.   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 Nails are of normal length thickness and coloration.   Vascular:  Dorsalis Pedis artery and Posterior Tibial artery pedal pulses are 2/4 bilateral.  Capillary fill time < 3 sec to all digits.   Neruologic: Grossly intact via light touch bilateral. Protective threshold intact to all sites bilateral.   Musculoskeletal: Tender with palpation at the back of the right heel at  the posterior superior aspect of the calcaneus and at the insertion point of the Achilles tendon.  Pain is worse with ankle dorsiflexion range of motion.  There is also tenderness on the left dorsal midfoot and left ankle.  Gait: Unassisted, Nonantalgic.   No images are attached to the encounter.  Radiographs:  Date: 07/03/2022 XR bilateral foot weightbearing AP/Lateral/Oblique   Findings: On the right foot there is on the large prominent osseous spurring at the posterior aspect of the calcaneus at the insertion point of the Achilles as well as Haglund's deformity present.  There is also a large spur on the back of the left heel as well.  There is dorsal midfoot spurring noted on the left foot at the talonavicular joint secondary to osteoarthritis Assessment:   1. 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 DM type 2 with diabetic peripheral neuropathy (HCC)   3. Bone spur of foot   4. Capsulitis of foot      Plan:  Patient was evaluated and treated and all questions answered.  # Haglund's deformity and insertional calcific Achilles tendinitis of the right lower extremity -Discussed with patient she does have evidence of insertional Achilles tendinitis with some calcification and retrocalcaneal exostosis and Haglund's deformity -Discussed conservative measures including stretching exercises which were provided patient, icing, anti-inflammatory medications -eRx for meloxicam 15 mg take once daily as needed for pain -eRx for methylprednisolone 4 mg steroid taper pack take as directed for 6 days -Recommend good supportive shoes with a thick heel as well as heel lifts which were dispensed to the patient   # Neuropathic pain of left foot secondary to osseous spurring as well as diabetes -Recommend trial of gabapentin 300 mg 3 times daily for neuropathic pain -Discussed the risks and benefits associate with gabapentin including risk of drowsiness   Return in about 4 weeks  (around 07/31/2022) for Follow-up right Achilles tendinitis.          Corinna Gab, DPM [facility] / [facility]

## 2022-07-03 NOTE — Patient Instructions (Signed)
Achilles Tendinitis  with Rehab Achilles tendinitis is a disorder of the Achilles tendon. The Achilles tendon connects the large calf muscles (Gastrocnemius and Soleus) to the heel bone (calcaneus). This tendon is sometimes called the heel cord. It is important for pushing-off and standing on your toes and is important for walking, running, or jumping. Tendinitis is often caused by overuse and repetitive microtrauma. SYMPTOMS  Pain, tenderness, swelling, warmth, and redness may occur over the Achilles tendon even at rest.  Pain with pushing off, or flexing or extending the ankle.  Pain that is worsened after or during activity. CAUSES   Overuse sometimes seen with rapid increase in exercise programs or in sports requiring running and jumping.  Poor physical conditioning (strength and flexibility or endurance).  Running sports, especially training running down hills.  Inadequate warm-up before practice or play or failure to stretch before participation.  Injury to the tendon. PREVENTION   Warm up and stretch before practice or competition.  Allow time for adequate rest and recovery between practices and competition.  Keep up conditioning.  Keep up ankle and leg flexibility.  Improve or keep muscle strength and endurance.  Improve cardiovascular fitness.  Use proper technique.  Use proper equipment (shoes, skates).  To help prevent recurrence, taping, protective strapping, or an adhesive bandage may be recommended for several weeks after healing is complete. PROGNOSIS   Recovery may take weeks to several months to heal.  Longer recovery is expected if symptoms have been prolonged.  Recovery is usually quicker if the inflammation is due to a direct blow as compared with overuse or sudden strain. RELATED COMPLICATIONS   Healing time will be prolonged if the condition is not correctly treated. The injury must be given plenty of time to heal.  Symptoms can reoccur if  activity is resumed too soon.  Untreated, tendinitis may increase the risk of tendon rupture requiring additional time for recovery and possibly surgery. TREATMENT   The first treatment consists of rest anti-inflammatory medication, and ice to relieve the pain.  Stretching and strengthening exercises after resolution of pain will likely help reduce the risk of recurrence. Referral to a physical therapist or athletic trainer for further evaluation and treatment may be helpful.  A walking boot or cast may be recommended to rest the Achilles tendon. This can help break the cycle of inflammation and microtrauma.  Arch supports (orthotics) may be prescribed or recommended by your caregiver as an adjunct to therapy and rest.  Surgery to remove the inflamed tendon lining or degenerated tendon tissue is rarely necessary and has shown less than predictable results. MEDICATION   Nonsteroidal anti-inflammatory medications, such as aspirin and ibuprofen, may be used for pain and inflammation relief. Do not take within 7 days before surgery. Take these as directed by your caregiver. Contact your caregiver immediately if any bleeding, stomach upset, or signs of allergic reaction occur. Other minor pain relievers, such as acetaminophen, may also be used.  Pain relievers may be prescribed as necessary by your caregiver. Do not take prescription pain medication for longer than 4 to 7 days. Use only as directed and only as much as you need. HEAT AND COLD  Cold is used to relieve pain and reduce inflammation for acute and chronic Achilles tendinitis. Cold should be applied for 10 to 15 minutes every 2 to 3 hours for inflammation and pain and immediately after any activity that aggravates your symptoms. Use ice packs or an ice massage.  Heat may be used   before performing stretching and strengthening activities prescribed by your caregiver. Use a heat pack or a warm soak. SEEK MEDICAL CARE IF:  Symptoms get  worse or do not improve in 2 weeks despite treatment.  New, unexplained symptoms develop. Drugs used in treatment may produce side effects.   EXERCISES-- hold each stretch for 30 seconds and repeat 10 times.  Complete each stretch 3 times per day.   RANGE OF MOTION (ROM) AND STRETCHING EXERCISES - Achilles Tendinitis  These exercises may help you when beginning to rehabilitate your injury. Your symptoms may resolve with or without further involvement from your physician, physical therapist or athletic trainer. While completing these exercises, remember:   Restoring tissue flexibility helps normal motion to return to the joints. This allows healthier, less painful movement and activity.  An effective stretch should be held for at least 30 seconds.  A stretch should never be painful. You should only feel a gentle lengthening or release in the stretched tissue. STRETCH  Gastroc, Standing   Place hands on wall.  Extend right / left leg, keeping the front knee somewhat bent.  Slightly point your toes inward on your back foot.  Keeping your right / left heel on the floor and your knee straight, shift your weight toward the wall, not allowing your back to arch.  You should feel a gentle stretch in the right / left calf. Hold this position for __________ seconds. Repeat __________ times. Complete this stretch __________ times per day. STRETCH  Soleus, Standing   Place hands on wall.  Extend right / left leg, keeping the other knee somewhat bent.  Slightly point your toes inward on your back foot.  Keep your right / left heel on the floor, bend your back knee, and slightly shift your weight over the back leg so that you feel a gentle stretch deep in your back calf.  Hold this position for __________ seconds. Repeat __________ times. Complete this stretch __________ times per day. STRETCH  Gastrocsoleus, Standing  Note: This exercise can place a lot of stress on your foot and ankle.  Please complete this exercise only if specifically instructed by your caregiver.   Place the ball of your right / left foot on a step, keeping your other foot firmly on the same step.  Hold on to the wall or a rail for balance.  Slowly lift your other foot, allowing your body weight to press your heel down over the edge of the step.  You should feel a stretch in your right / left calf.  Hold this position for __________ seconds.  Repeat this exercise with a slight bend in your knee. Repeat __________ times. Complete this stretch __________ times per day.  STRENGTHENING EXERCISES - Achilles Tendinitis These exercises may help you when beginning to rehabilitate your injury. They may resolve your symptoms with or without further involvement from your physician, physical therapist or athletic trainer. While completing these exercises, remember:   Muscles can gain both the endurance and the strength needed for everyday activities through controlled exercises.  Complete these exercises as instructed by your physician, physical therapist or athletic trainer. Progress the resistance and repetitions only as guided.  You may experience muscle soreness or fatigue, but the pain or discomfort you are trying to eliminate should never worsen during these exercises. If this pain does worsen, stop and make certain you are following the directions exactly. If the pain is still present after adjustments, discontinue the exercise until you can discuss   the trouble with your clinician. STRENGTH - Plantar-flexors   Sit with your right / left leg extended. Holding onto both ends of a rubber exercise band/tubing, loop it around the ball of your foot. Keep a slight tension in the band.  Slowly push your toes away from you, pointing them downward.  Hold this position for __________ seconds. Return slowly, controlling the tension in the band/tubing. Repeat __________ times. Complete this exercise __________ times  per day.  STRENGTH - Plantar-flexors   Stand with your feet shoulder width apart. Steady yourself with a wall or table using as little support as needed.  Keeping your weight evenly spread over the width of your feet, rise up on your toes.*  Hold this position for __________ seconds. Repeat __________ times. Complete this exercise __________ times per day.  *If this is too easy, shift your weight toward your right / left leg until you feel challenged. Ultimately, you may be asked to do this exercise with your right / left foot only. STRENGTH  Plantar-flexors, Eccentric  Note: This exercise can place a lot of stress on your foot and ankle. Please complete this exercise only if specifically instructed by your caregiver.   Place the balls of your feet on a step. With your hands, use only enough support from a wall or rail to keep your balance.  Keep your knees straight and rise up on your toes.  Slowly shift your weight entirely to your right / left toes and pick up your opposite foot. Gently and with controlled movement, lower your weight through your right / left foot so that your heel drops below the level of the step. You will feel a slight stretch in the back of your calf at the end position.  Use the healthy leg to help rise up onto the balls of both feet, then lower weight only on the right / left leg again. Build up to 15 repetitions. Then progress to 3 consecutive sets of 15 repetitions.*  After completing the above exercise, complete the same exercise with a slight knee bend (about 30 degrees). Again, build up to 15 repetitions. Then progress to 3 consecutive sets of 15 repetitions.* Perform this exercise __________ times per day.  *When you easily complete 3 sets of 15, your physician, physical therapist or athletic trainer may advise you to add resistance by wearing a backpack filled with additional weight. STRENGTH - Plantar Flexors, Seated   Sit on a chair that allows your feet  to rest flat on the ground. If necessary, sit at the edge of the chair.  Keeping your toes firmly on the ground, lift your right / left heel as far as you can without increasing any discomfort in your ankle. Repeat __________ times. Complete this exercise __________ times a day. *If instructed by your physician, physical therapist or athletic trainer, you may add ____________________ of resistance by placing a weighted object on your right / left knee. Document Released: 07/27/2004 Document Revised: 03/20/2011 Document Reviewed: 04/09/2008 [employer] Patient Information 2014 [employer].    

## 2022-07-04 ENCOUNTER — Encounter: Payer: Self-pay | Admitting: Podiatry

## 2022-07-31 ENCOUNTER — Ambulatory Visit (INDEPENDENT_AMBULATORY_CARE_PROVIDER_SITE_OTHER): Payer: BC Managed Care – PPO | Admitting: Podiatry

## 2022-07-31 DIAGNOSIS — M19072 Primary osteoarthritis, left ankle and foot: Secondary | ICD-10-CM

## 2022-07-31 DIAGNOSIS — M7750 Other enthesopathy of unspecified foot: Secondary | ICD-10-CM | POA: Diagnosis not present

## 2022-07-31 DIAGNOSIS — M6528 Calcific tendinitis, other site: Secondary | ICD-10-CM

## 2022-07-31 DIAGNOSIS — M19071 Primary osteoarthritis, right ankle and foot: Secondary | ICD-10-CM | POA: Diagnosis not present

## 2022-07-31 DIAGNOSIS — E1142 Type 2 diabetes mellitus with diabetic polyneuropathy: Secondary | ICD-10-CM

## 2022-07-31 NOTE — Progress Notes (Signed)
Subjective:  Patient ID: Debra Wolf, female    DOB: [birth year],  MRN: [id number]  Chief Complaint  Patient presents with   Follow-up    Calcific achilles tendinitis of right lower extremity. Continues to have severe pain. No relief with the steroid pack and taking the meloxicam as needed. Patient is currently taking Gabapentin twice a day instead of 3 times a day. She is not able to use the cam boot at work because it was causing more pain.     52 y.o. female presents for follow-up of of right lower extremity Achilles tendinitis and retrocalcaneal exostosis.  She is also having pain in the left dorsal midfoot in the right anterior ankle.  Past Medical History:  Diagnosis Date   Angina pectoris (HCC) 07/06/2021   Anxiety    Asthma    Benign essential hypertension    Chest pain 07/18/2017   Last Assessment & Plan:  Formatting of this note might be different from the original. She describes a midsternal chest heaviness times 60-90 minutes that was relieved by nitroglycerin.  Troponin is negative and EKG does not suggest acute ischemic changes.  Echo from April of this year showed normal cardiac size and function without valvular abnormalities.  CTA performed in August of this year sug   Chronic pain 04/24/2014   COPD (chronic obstructive pulmonary disease) (HCC)    COPD exacerbation (HCC) 07/08/2017   Last Assessment & Plan:  Formatting of this note might be different from the original. Takes Symbicort.  Needs to quit smoking.  Continue management per primary team.   Depression 10/26/2019   Diabetes (HCC) 02/22/2017   Diabetes mellitus (HCC) 02/22/2017   Formatting of this note might be different from the original. diet controlled Formatting of this note might be different from the original. diet controlled   DOE (dyspnea on exertion) 02/28/2017   Dyslipidemia    Dyspnea and respiratory abnormality 05/30/2012   Ectopic atrial rhythm    Esophageal reflux 05/30/2012   Essential  hypertension 05/30/2012   Last Assessment & Plan:  Formatting of this note might be different from the original. Stable; continue management per primary team   GERD (gastroesophageal reflux disease)    Hypophosphatemia 10/26/2019   Hypothyroid    Hypoxia 07/08/2017   Leukocytosis 10/26/2019   Mass of ovary    Mild CAD 03/30/2021   Mixed hyperlipidemia 03/03/2019   Morbid obesity (HCC) 12/28/2016   Neck pain 04/27/2014   Nervousness 05/30/2012   Neuromuscular disorder (HCC)    esentrial tremors   Pain in joint involving lower leg 05/30/2012   Pain medication agreement signed 07/06/2016   Overview:  Overview:  [facility] opiod agreement reviewed, signed and copy given to patient. Overview:  [facility] opiod agreement reviewed, signed and copy given to patient.   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 Nails are of normal length thickness and coloration.   Vascular:  Dorsalis  Pedis artery and Posterior Tibial artery pedal pulses are 2/4 bilateral.  Capillary fill time < 3 sec to all digits.   Neruologic: Grossly intact via light touch bilateral. Protective threshold intact to all sites bilateral.   Musculoskeletal: Tender with palpation at the back of the right heel at the posterior superior aspect of the calcaneus and at the insertion point of the Achilles tendon.  Pain is worse with ankle dorsiflexion  range of motion.  There is also tenderness on the left dorsal midfoot and left ankle.  Gait: Unassisted, Nonantalgic.   No images are attached to the encounter.  Radiographs:  Date: 07/03/2022 XR bilateral foot weightbearing AP/Lateral/Oblique   Findings: On the right foot there is on the large prominent osseous spurring at the posterior aspect of the calcaneus at the insertion point of the Achilles as well as Haglund's deformity present.  There is also a large spur on the back of the left heel as well.  There is dorsal midfoot spurring noted on the left foot at the talonavicular joint secondary to osteoarthritis Assessment:   1. 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 Arthritis of ankle, right   3. Bone spur of foot   4. DM type 2 with diabetic peripheral neuropathy (HCC)   5. Arthritis of left midfoot       Plan:  Patient was evaluated and treated and all questions answered.  # Haglund's deformity and insertional calcific Achilles tendinitis of the right lower extremity -Continue stretching icing anti-inflammatory medications -Would consider surgical and mention for right posterior heel pain if needed if not improved in future  # Dorsal midfoot spur of left foot -Recommend steroid injection for pain reduction at this time patient was agreeable -After sterile Prep injected 1 cc half percent Marcaine plain with 1 cc Kenalog 10 in the left dorsal midfoot  # Right ankle arthritis -Recommend steroid injection into the right anterior ankle  -After sterile prep injected 1 cc half percent Marcaine plain with 1 cc Kenalog 10 to the right anterior ankle  # Neuropathic pain of left foot secondary to osseous spurring as well as diabetes -Continue gabapentin  Return if symptoms worsen or fail to improve.          Corinna Gab, DPM [facility] / [facility]

## 2022-08-14 DIAGNOSIS — J329 Chronic sinusitis, unspecified: Secondary | ICD-10-CM | POA: Diagnosis not present

## 2022-08-14 DIAGNOSIS — L989 Disorder of the skin and subcutaneous tissue, unspecified: Secondary | ICD-10-CM | POA: Diagnosis not present

## 2022-09-07 DIAGNOSIS — D225 Melanocytic nevi of trunk: Secondary | ICD-10-CM | POA: Diagnosis not present

## 2022-09-07 DIAGNOSIS — D2239 Melanocytic nevi of other parts of face: Secondary | ICD-10-CM | POA: Diagnosis not present

## 2022-09-07 DIAGNOSIS — L814 Other melanin hyperpigmentation: Secondary | ICD-10-CM | POA: Diagnosis not present

## 2022-09-07 DIAGNOSIS — D485 Neoplasm of uncertain behavior of skin: Secondary | ICD-10-CM | POA: Diagnosis not present

## 2022-10-09 ENCOUNTER — Ambulatory Visit: Payer: BC Managed Care – PPO | Admitting: Podiatry

## 2022-10-27 DIAGNOSIS — Z6841 Body Mass Index (BMI) 40.0 and over, adult: Secondary | ICD-10-CM | POA: Diagnosis not present

## 2022-10-27 DIAGNOSIS — M109 Gout, unspecified: Secondary | ICD-10-CM | POA: Diagnosis not present

## 2022-10-27 DIAGNOSIS — Z0000 Encounter for general adult medical examination without abnormal findings: Secondary | ICD-10-CM | POA: Diagnosis not present

## 2022-10-27 DIAGNOSIS — E119 Type 2 diabetes mellitus without complications: Secondary | ICD-10-CM | POA: Diagnosis not present

## 2022-10-27 DIAGNOSIS — Z131 Encounter for screening for diabetes mellitus: Secondary | ICD-10-CM | POA: Diagnosis not present

## 2022-10-30 DIAGNOSIS — E039 Hypothyroidism, unspecified: Secondary | ICD-10-CM | POA: Diagnosis not present

## 2022-10-30 DIAGNOSIS — R7989 Other specified abnormal findings of blood chemistry: Secondary | ICD-10-CM | POA: Diagnosis not present

## 2022-11-15 ENCOUNTER — Encounter: Payer: Self-pay | Admitting: Podiatry

## 2022-11-15 ENCOUNTER — Ambulatory Visit: Payer: BC Managed Care – PPO

## 2022-11-15 ENCOUNTER — Ambulatory Visit (INDEPENDENT_AMBULATORY_CARE_PROVIDER_SITE_OTHER): Payer: BC Managed Care – PPO | Admitting: Podiatry

## 2022-11-15 DIAGNOSIS — M79671 Pain in right foot: Secondary | ICD-10-CM

## 2022-11-15 DIAGNOSIS — M79672 Pain in left foot: Secondary | ICD-10-CM | POA: Diagnosis not present

## 2022-11-15 DIAGNOSIS — M6528 Calcific tendinitis, other site: Secondary | ICD-10-CM | POA: Diagnosis not present

## 2022-11-15 DIAGNOSIS — M19072 Primary osteoarthritis, left ankle and foot: Secondary | ICD-10-CM | POA: Diagnosis not present

## 2022-11-15 MED ORDER — TRAMADOL HCL 50 MG PO TABS: 50.0000 mg | ORAL_TABLET | Freq: Three times a day (TID) | ORAL | 0 refills | Status: DC | PRN

## 2022-11-15 NOTE — Progress Notes (Signed)
Chief Complaint  Patient presents with   Foot Pain    Denies any improvement of symptoms from 07/31/22. States injections provided no relief. Pain to right achilles insertion, right heel, right anterior ankle, right medial ankle along arch/PT tendon. Left foot ankle and similar PT tendon distribution pain, dorsal left midfoot pain and swelling. She reports trying various shoes and powerstep inserts    HPI: 52 y.o. female presenting today for f/u of pain in the back of the right heel.  States she isn't doing any better. Notes pain is 10/10.  Past Medical History:  Diagnosis Date   Angina pectoris (HCC) 07/06/2021   Anxiety    Asthma    Benign essential hypertension    Chest pain 07/18/2017   Last Assessment & Plan:  Formatting of this note might be different from the original. She describes a midsternal chest heaviness times 60-90 minutes that was relieved by nitroglycerin.  Troponin is negative and EKG does not suggest acute ischemic changes.  Echo from April of this year showed normal cardiac size and function without valvular abnormalities.  CTA performed in August of this year sug   Chronic pain 04/24/2014   COPD (chronic obstructive pulmonary disease) (HCC)    COPD exacerbation (HCC) 07/08/2017   Last Assessment & Plan:  Formatting of this note might be different from the original. Takes Symbicort.  Needs to quit smoking.  Continue management per primary team.   Depression 10/26/2019   Diabetes (HCC) 02/22/2017   Diabetes mellitus (HCC) 02/22/2017   Formatting of this note might be different from the original. diet controlled Formatting of this note might be different from the original. diet controlled   DOE (dyspnea on exertion) 02/28/2017   Dyslipidemia    Dyspnea and respiratory abnormality 05/30/2012   Ectopic atrial rhythm    Esophageal reflux 05/30/2012   Essential hypertension 05/30/2012   Last Assessment & Plan:  Formatting of this note might be different from the  original. Stable; continue management per primary team   GERD (gastroesophageal reflux disease)    Hypophosphatemia 10/26/2019   Hypothyroid    Hypoxia 07/08/2017   Leukocytosis 10/26/2019   Mass of ovary    Mild CAD 03/30/2021   Mixed hyperlipidemia 03/03/2019   Morbid obesity (HCC) 12/28/2016   Neck pain 04/27/2014   Nervousness 05/30/2012   Neuromuscular disorder (HCC)    esentrial tremors   Pain in joint involving lower leg 05/30/2012   Pain medication agreement signed 07/06/2016   Overview:  Overview:  [facility] opiod agreement reviewed, signed and copy given to patient. Overview:  [facility] opiod agreement reviewed, signed and copy given to patient.   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 Minimal hiatal hernia. Otherwise, normal EGD   PARTIAL HYSTERECTOMY     removal of deep brain stimulator  05/16/2016   Dr. Dickey Gave at [facility]   TUBAL LIGATION     UPPER GASTROINTESTINAL ENDOSCOPY  2004    Allergies  Allergen Reactions   Penicillins Hives and Swelling    Tolerated amoxicillin course in 2014     Vancomycin Anaphylaxis   Cephalexin Itching  Physical Exam: General: The patient is alert and oriented x3 in no acute distress.  Dermatology:  No ecchymosis, erythema, or edema bilateral.  No open lesions.    Vascular: Palpable pedal pulses bilaterally. Capillary refill within normal limits.  No appreciable edema.    Neurological: Light touch sensation intact bilateral.  MMT 5/5 to lower extremity bilateral. Negative Tinel's sign with percussion of the posterior tibial nerve on the affected extremity.    Musculoskeletal  Exam:  There is pain on palpation of the posterior aspect of the right heel.  No palpable gaps or nodules noted within the achilles tendon.  Antalgic gait noted with first steps out of exam chair.  No pain on palpation of the plantar heel.  No ecchymosis or edema to posterior heel.  Pain on palpation left dorsal midfoot.    Radiographic Exam (B/L foot, 3 views):  Normal osseous mineralization.  Posterior spur right calcaneus with thickening of achilles tendon.  No fracture seen.  Moderate dorsal spurring at navicular - medial cuneiform joint on left foot.  Assessment/Plan of Care: 1. 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 Bilateral foot pain   3. Primary osteoarthritis of left foot     Meds ordered this encounter  Medications   traMADol (ULTRAM) 50 MG tablet    Sig: Take 1 tablet (50 mg total) by mouth every 8 (eight) hours as needed.    Dispense:  30 tablet    Refill:  0   MR ANKLE RIGHT WO CONTRAST  -Reviewed etiology of achilles tendonitis with patient.  Discussed treatment options with patient today, including cortisone injection, NSAID course of treatment, stretching exercises, use of night splint, physical therapy, rest, icing the heel, arch supports/orthotics, and supportive shoe gear.    Will set patient up for right ankle MRI to evaluate achilles tendon since she is not having any improvement after being treated by Dr. Annamary Rummage.  Due to her significant pain, will keep her out of work for the next 4 weeks while awaiting the MRI and the report from the radiologist.  Rx tramadol 50mg  for pain management for now.  Dispensed a gel achilles relief sleeve to wear right foot.  She has felt heel lifts at home she can wear.     Clerance Lav, DPM, FACFAS [facility]     [address] [place], [place] [address]                Office [phone]  Fax [phone]

## 2022-11-18 DIAGNOSIS — M19072 Primary osteoarthritis, left ankle and foot: Secondary | ICD-10-CM

## 2022-11-18 DIAGNOSIS — M7661 Achilles tendinitis, right leg: Secondary | ICD-10-CM

## 2022-11-18 DIAGNOSIS — M6528 Calcific tendinitis, other site: Secondary | ICD-10-CM | POA: Insufficient documentation

## 2022-11-18 HISTORY — DX: Primary osteoarthritis, left ankle and foot: M19.072

## 2022-11-18 HISTORY — DX: Calcific tendinitis, other site: M65.28

## 2022-11-18 HISTORY — DX: Achilles tendinitis, right leg: M76.61

## 2022-11-21 ENCOUNTER — Other Ambulatory Visit: Payer: Self-pay

## 2022-11-22 ENCOUNTER — Ambulatory Visit: Payer: BC Managed Care – PPO | Attending: Cardiology | Admitting: Cardiology

## 2022-11-27 ENCOUNTER — Telehealth: Payer: Self-pay | Admitting: Podiatry

## 2022-11-27 NOTE — Telephone Encounter (Signed)
LVM

## 2022-11-27 NOTE — Telephone Encounter (Signed)
Patient called, stated her sleeve for her boot is causing swelling/ soreness. She wants to know if she can wear a compression sock instead.

## 2022-12-13 ENCOUNTER — Encounter (INDEPENDENT_AMBULATORY_CARE_PROVIDER_SITE_OTHER): Payer: BC Managed Care – PPO | Admitting: Podiatry

## 2022-12-13 DIAGNOSIS — Z91199 Patient's noncompliance with other medical treatment and regimen due to unspecified reason: Secondary | ICD-10-CM

## 2022-12-14 NOTE — Progress Notes (Signed)
Patient was a no-show for her scheduled follow-up appointment on 12/13/2022.

## 2023-06-21 LAB — LAB REPORT - SCANNED
A1c: 5.2
EGFR: 60
Free T4: 1.09
TSH: 1.55 (ref 0.41–5.90)

## 2023-10-19 DIAGNOSIS — E669 Obesity, unspecified: Secondary | ICD-10-CM | POA: Diagnosis not present

## 2023-10-19 DIAGNOSIS — S0990XA Unspecified injury of head, initial encounter: Secondary | ICD-10-CM | POA: Diagnosis not present

## 2023-10-19 DIAGNOSIS — E119 Type 2 diabetes mellitus without complications: Secondary | ICD-10-CM | POA: Diagnosis not present

## 2023-10-19 DIAGNOSIS — F17210 Nicotine dependence, cigarettes, uncomplicated: Secondary | ICD-10-CM | POA: Diagnosis not present

## 2023-10-22 DIAGNOSIS — R55 Syncope and collapse: Secondary | ICD-10-CM | POA: Diagnosis not present

## 2023-10-22 DIAGNOSIS — M62838 Other muscle spasm: Secondary | ICD-10-CM | POA: Diagnosis not present

## 2023-10-22 DIAGNOSIS — Z6841 Body Mass Index (BMI) 40.0 and over, adult: Secondary | ICD-10-CM | POA: Diagnosis not present

## 2023-10-22 DIAGNOSIS — K219 Gastro-esophageal reflux disease without esophagitis: Secondary | ICD-10-CM | POA: Diagnosis not present

## 2023-11-09 DIAGNOSIS — R55 Syncope and collapse: Secondary | ICD-10-CM | POA: Diagnosis not present

## 2023-11-15 ENCOUNTER — Ambulatory Visit: Attending: Cardiology | Admitting: Cardiology

## 2023-11-15 ENCOUNTER — Encounter: Payer: Self-pay | Admitting: Cardiology

## 2023-11-15 VITALS — BP 138/70 | HR 79 | Ht 68.0 in | Wt 261.2 lb

## 2023-11-15 DIAGNOSIS — I10 Essential (primary) hypertension: Secondary | ICD-10-CM | POA: Diagnosis not present

## 2023-11-15 DIAGNOSIS — F17200 Nicotine dependence, unspecified, uncomplicated: Secondary | ICD-10-CM

## 2023-11-15 DIAGNOSIS — J431 Panlobular emphysema: Secondary | ICD-10-CM | POA: Diagnosis not present

## 2023-11-15 DIAGNOSIS — E785 Hyperlipidemia, unspecified: Secondary | ICD-10-CM | POA: Diagnosis not present

## 2023-11-15 DIAGNOSIS — R011 Cardiac murmur, unspecified: Secondary | ICD-10-CM | POA: Diagnosis not present

## 2023-11-15 DIAGNOSIS — E782 Mixed hyperlipidemia: Secondary | ICD-10-CM

## 2023-11-15 DIAGNOSIS — R55 Syncope and collapse: Secondary | ICD-10-CM | POA: Diagnosis not present

## 2023-11-15 DIAGNOSIS — I2510 Atherosclerotic heart disease of native coronary artery without angina pectoris: Secondary | ICD-10-CM

## 2023-11-15 MED ORDER — NITROGLYCERIN 0.4 MG SL SUBL: SUBLINGUAL_TABLET | SUBLINGUAL | 6 refills | Status: AC

## 2023-11-15 NOTE — Progress Notes (Signed)
 Cardiology Office Note:    Date:  11/15/2023   ID:  Debra Wolf, DOB 1970/12/18, MRN [id number]  PCP:  Ina Marcellus RAMAN, MD  Cardiologist:  Jennifer JONELLE Crape, MD   Referring MD: Ina Marcellus RAMAN, MD    ASSESSMENT:    1. Dyslipidemia   2. Benign essential hypertension   3. Mild CAD   4. Panlobular emphysema (HCC)   5. Morbid obesity (HCC)   6. Mixed hyperlipidemia   7. Tobacco use disorder   8. Syncope and collapse   9. Cardiac murmur    PLAN:    In order of problems listed above:  Coronary artery disease: Mild in nature.  Coronary angiography report was reviewed and discussed with her at length and questions were answered to her satisfaction.  She does not have any chest pain.  She request nitroglycerin  to keep headache and I will send her a prescription. Syncope and collapse: I reviewed the event monitor report.  115-second SVT was noted otherwise it was largely unremarkable.  We discussed this with the patient at length and questions were answered to her satisfaction. Essential hypertension, blood pressure stable and diet was emphasized. Cigarette smoker: I spent 5 minutes with the patient discussing solely about smoking. Smoking cessation was counseled. I suggested to the patient also different medications and pharmacological interventions. Patient is keen to try stopping on its own at this time. He will get back to me if he needs any further assistance in this matter. Obesity: Weight reduction stressed diet emphasized and she promises to do better. I discussed statin therapy.  She needs to be on this.  This will be followed by primary care.  Goal LDL less than 60. Patient will be seen in follow-up appointment in 6 months or earlier if the patient has any concerns.    Medication Adjustments/Labs and Tests Ordered: Current medicines are reviewed at length with the patient today.  Concerns regarding medicines are outlined above.  Orders Placed This Encounter  Procedures   EKG  12-Lead   No orders of the defined types were placed in this encounter.    No chief complaint on file.    History of Present Illness:    Debra Wolf is a 53 y.o. female.  Patient has past medical history of mild coronary artery disease, essential hypertension, mixed dyslipidemia, COPD and unfortunately continues to smoke.  She recently had a syncopal event and subsequently was evaluated by primary care.  She has had a ZIO monitor and she does not know the report yet.  No chest pain orthopnea or PND.  She takes care of activities of daily living.  She does not exercise on a regular basis.  At the time of my evaluation, the patient is alert awake oriented and in no distress.  Past Medical History:  Diagnosis Date   Angina pectoris 07/06/2021   Anxiety    Asthma    Benign essential hypertension    Calcific Achilles tendinitis of right lower extremity 11/18/2022   Chest pain 07/18/2017   Last Assessment & Plan:  Formatting of this note might be different from the original. She describes a midsternal chest heaviness times 60-90 minutes that was relieved by nitroglycerin .  Troponin is negative and EKG does not suggest acute ischemic changes.  Echo from April of this year showed normal cardiac size and function without valvular abnormalities.  CTA performed in August of this year sug   Chronic pain 04/24/2014   COPD (chronic obstructive pulmonary disease) (HCC)  COPD exacerbation (HCC) 07/08/2017   Last Assessment & Plan:  Formatting of this note might be different from the original. Takes Symbicort.  Needs to quit smoking.  Continue management per primary team.   Depression 10/26/2019   Diabetes (HCC) 02/22/2017   Diabetes mellitus (HCC) 02/22/2017   Formatting of this note might be different from the original. diet controlled Formatting of this note might be different from the original. diet controlled   DOE (dyspnea on exertion) 02/28/2017   Dyslipidemia    Dyspnea and respiratory  abnormality 05/30/2012   Ectopic atrial rhythm    Esophageal reflux 05/30/2012   Essential hypertension 05/30/2012   Last Assessment & Plan:  Formatting of this note might be different from the original. Stable; continue management per primary team   GERD (gastroesophageal reflux disease)    Hypophosphatemia 10/26/2019   Hypothyroid    Hypoxia 07/08/2017   Leukocytosis 10/26/2019   Mass of ovary    Mild CAD 03/30/2021   Mixed hyperlipidemia 03/03/2019   Morbid obesity (HCC) 12/28/2016   Neck pain 04/27/2014   Nervousness 05/30/2012   Neuromuscular disorder (HCC)    esentrial tremors   Pain in joint involving lower leg 05/30/2012   Pain medication agreement signed 07/06/2016   Overview:  Overview:  [facility] opiod agreement reviewed, signed and copy given to patient. Overview:  [facility] opiod agreement reviewed, signed and copy given to patient.   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 Minimal hiatal hernia. Otherwise, normal EGD   PARTIAL HYSTERECTOMY     removal of deep brain stimulator  05/16/2016   Dr. Corine [facility] at [facility]   TUBAL LIGATION     UPPER GASTROINTESTINAL ENDOSCOPY  2004    Current Medications: Current Meds  Medication Sig   BREZTRI AEROSPHERE 160-9-4.8 MCG/ACT AERO inhaler Inhale 2 puffs into the lungs 2 (two) times daily.   celecoxib (CELEBREX) 200 MG capsule Take 200 mg by mouth daily.   valsartan-hydrochlorothiazide (DIOVAN-HCT) 160-12.5 MG tablet Take 1 tablet by mouth daily.   Vitamin D , Ergocalciferol , (DRISDOL ) 1.25 MG (50000 UNIT) CAPS capsule Take 50,000 Units by mouth once a week.      Allergies:   Penicillins, Vancomycin, and Cephalexin   Social History   Socioeconomic History   Marital status: Married    Spouse name: Not on file   Number of children: 2   Years of education: Not on file   Highest education level: Not on file  Occupational History   Occupation: disabled  Tobacco Use   Smoking status: Every Day    Current packs/day: 1.00    Types: Cigarettes   Smokeless tobacco: Never  Vaping Use   Vaping status: Never Used  Substance and Sexual Activity   Alcohol use: Not Currently   Drug use: No   Sexual activity: Not on file  Other Topics Concern   Not on file  Social History Narrative   Not on file   Social Drivers of Health   Financial Resource Strain: Not on file  Food Insecurity: Not on file  Transportation Needs: Not on file  Physical Activity: Not on file  Stress: Not on  file  Social Connections: Not on file     Family History: The patient's family history includes Bladder Cancer in her mother; Cirrhosis in her maternal uncle; Congestive Heart Failure in her father and mother; Lung cancer in her father; Ovarian cancer in her sister. There is no history of Colon cancer, Liver cancer, Colon polyps, Esophageal cancer, Stomach cancer, or Rectal cancer.  ROS:   Please see the history of present illness.    All other systems reviewed and are negative.  EKGs/Labs/Other Studies Reviewed:    The following studies were reviewed today: .SABRAEKG Interpretation Date/Time:  Thursday November 15 2023 09:42:36 EST Ventricular Rate:  79 PR Interval:  128 QRS Duration:  90 QT Interval:  416 QTC Calculation: 477 R Axis:   108  Text Interpretation: Unusual P axis and short PR, probable junctional tachycardia Rightward axis Nonspecific ST and T wave abnormality Prolonged QT Abnormal ECG No previous ECGs available Confirmed by Edwyna Backers [phone]) on 11/15/2023 10:03:36 AM     Recent Labs: No results found for requested labs within last 365 days.   Recent Lipid Panel    Component Value Date/Time   CHOL 166 04/01/2021 1018   TRIG 136 04/01/2021 1018   HDL 39 (L) 04/01/2021 1018   CHOLHDL 4.3 04/01/2021 1018   LDLCALC 103 (H) 04/01/2021 1018    Physical Exam:    VS:  BP 138/70   Pulse 79   Ht 5' 8 (1.727 m)   Wt 261 lb 3.2 oz (118.5 kg)   LMP  (LMP Unknown) Comment: partial hysterectomy  SpO2 97%   BMI 39.72 kg/m     Wt Readings from Last 3 Encounters:  11/15/23 261 lb 3.2 oz (118.5 kg)  07/06/21 250 lb 9.6 oz (113.7 kg)  03/30/21 248 lb (112.5 kg)     GEN: Patient is in no acute distress HEENT: Normal NECK: No JVD; No carotid bruits LYMPHATICS: No lymphadenopathy CARDIAC: Hear sounds regular, 2/6 systolic murmur at the apex. RESPIRATORY:  Clear to auscultation without rales, wheezing or rhonchi  ABDOMEN: Soft, non-tender, non-distended MUSCULOSKELETAL:  No edema; No deformity  SKIN: Warm and dry NEUROLOGIC:  Alert and oriented x 3 PSYCHIATRIC:  Normal affect   Signed, Backers JONELLE Edwyna, MD  11/15/2023 10:12 AM     [facility]

## 2023-11-15 NOTE — Patient Instructions (Signed)
 Medication Instructions:  Your physician recommends that you continue on your current medications as directed. Please refer to the Current Medication list given to you today.  *If you need a refill on your cardiac medications before your next appointment, please call your pharmacy*   Lab Work: None ordered If you have labs (blood work) drawn today and your tests are completely normal, you will receive your results only by: MyChart Message (if you have MyChart) OR A paper copy in the mail If you have any lab test that is abnormal or we need to change your treatment, we will call you to review the results.  Testing/Procedures: Your physician has requested that you have an echocardiogram. Echocardiography is a painless test that uses sound waves to create images of your heart. It provides your doctor with information about the size and shape of your heart and how well your heart's chambers and valves are working. This procedure takes approximately one hour. There are no restrictions for this procedure. Please do NOT wear cologne, perfume, aftershave, or lotions (deodorant is allowed). Please arrive 15 minutes prior to your appointment time.  Please note: We ask at that you not bring children with you during ultrasound (echo/ vascular) testing. Due to room size and safety concerns, children are not allowed in the ultrasound rooms during exams. Our front office staff cannot provide observation of children in our lobby area while testing is being conducted. An adult accompanying a patient to their appointment will only be allowed in the ultrasound room at the discretion of the ultrasound technician under special circumstances. We apologize for any inconvenience.  Follow-Up: At [facility], you and your health needs are our priority.  As part of our continuing mission to provide you with exceptional heart care, we have created designated Provider Care Teams.  These Care Teams include your primary  Cardiologist (physician) and Advanced Practice Providers (APPs -  Physician Assistants and Nurse Practitioners) who all work together to provide you with the care you need, when you need it.  We recommend signing up for the patient portal called MyChart.  Sign up information is provided on this After Visit Summary.  MyChart is used to connect with patients for Virtual Visits (Telemedicine).  Patients are able to view lab/test results, encounter notes, upcoming appointments, etc.  Non-urgent messages can be sent to your provider as well.   To learn more about what you can do with MyChart, go to [email or web address].    Your next appointment:   9 month(s)  The format for your next appointment:   In Person  Provider:   Jennifer Crape, MD   Other Instructions Echocardiogram An echocardiogram is a test that uses sound waves (ultrasound) to produce images of the heart. Images from an echocardiogram can provide important information about: Heart size and shape. The size and thickness and movement of your heart's walls. Heart muscle function and strength. Heart valve function or if you have stenosis. Stenosis is when the heart valves are too narrow. If blood is flowing backward through the heart valves (regurgitation). A tumor or infectious growth around the heart valves. Areas of heart muscle that are not working well because of poor blood flow or injury from a heart attack. Aneurysm detection. An aneurysm is a weak or damaged part of an artery wall. The wall bulges out from the normal force of blood pumping through the body. Tell a health care provider about: Any allergies you have. All medicines you are taking, including vitamins, herbs,  eye drops, creams, and over-the-counter medicines. Any blood disorders you have. Any surgeries you have had. Any medical conditions you have. Whether you are pregnant or may be pregnant. What are the risks? Generally, this is a safe test. However,  problems may occur, including an allergic reaction to dye (contrast) that may be used during the test. What happens before the test? No specific preparation is needed. You may eat and drink normally. What happens during the test? You will take off your clothes from the waist up and put on a hospital gown. Electrodes or electrocardiogram (ECG)patches may be placed on your chest. The electrodes or patches are then connected to a device that monitors your heart rate and rhythm. You will lie down on a table for an ultrasound exam. A gel will be applied to your chest to help sound waves pass through your skin. A handheld device, called a transducer, will be pressed against your chest and moved over your heart. The transducer produces sound waves that travel to your heart and bounce back (or echo back) to the transducer. These sound waves will be captured in real-time and changed into images of your heart that can be viewed on a video monitor. The images will be recorded on a computer and reviewed by your health care provider. You may be asked to change positions or hold your breath for a short time. This makes it easier to get different views or better views of your heart. In some cases, you may receive contrast through an IV in one of your veins. This can improve the quality of the pictures from your heart. The procedure may vary among health care providers and hospitals.   What can I expect after the test? You may return to your normal, everyday life, including diet, activities, and medicines, unless your health care provider tells you not to do that. Follow these instructions at home: It is up to you to get the results of your test. Ask your health care provider, or the department that is doing the test, when your results will be ready. Keep all follow-up visits. This is important. Summary An echocardiogram is a test that uses sound waves (ultrasound) to produce images of the heart. Images from an  echocardiogram can provide important information about the size and shape of your heart, heart muscle function, heart valve function, and other possible heart problems. You do not need to do anything to prepare before this test. You may eat and drink normally. After the echocardiogram is completed, you may return to your normal, everyday life, unless your health care provider tells you not to do that. This information is not intended to replace advice given to you by your health care provider. Make sure you discuss any questions you have with your health care provider. Document Revised: 08/19/2019 Document Reviewed: 08/19/2019 Elsevier Patient Education  2021 [employer] Inc[employer]   Important Information About Sugar

## 2023-11-21 ENCOUNTER — Encounter: Payer: Self-pay | Admitting: Cardiology

## 2023-11-21 NOTE — Telephone Encounter (Signed)
 Recommendations reviewed with pt as per Dr. Kem Parkinson note.  Pt verbalized understanding and had no additional questions.

## 2023-11-21 NOTE — Telephone Encounter (Signed)
 Spoke with pt who states that she has had no improvement since her visit on 11/15/23. Pt reports that her heart rate is 88-90 and fatigue. Pt reports that she is lightheaded and dizzy when standing. Advised pt to keep a log of her BP with position changes and when felling lightheaded. Pt encouraged to call her PCP to see if they can extend her Zio for 14 additional days.  ED record in in EPIC.Pt verbalized understanding and had no additional questions. Please advise

## 2023-11-22 ENCOUNTER — Ambulatory Visit: Admitting: Cardiology

## 2023-11-22 DIAGNOSIS — E785 Hyperlipidemia, unspecified: Secondary | ICD-10-CM | POA: Diagnosis not present

## 2023-11-22 DIAGNOSIS — E119 Type 2 diabetes mellitus without complications: Secondary | ICD-10-CM | POA: Diagnosis not present

## 2023-11-22 DIAGNOSIS — R7989 Other specified abnormal findings of blood chemistry: Secondary | ICD-10-CM | POA: Diagnosis not present

## 2023-11-22 DIAGNOSIS — R002 Palpitations: Secondary | ICD-10-CM | POA: Diagnosis not present

## 2023-11-22 DIAGNOSIS — E039 Hypothyroidism, unspecified: Secondary | ICD-10-CM | POA: Diagnosis not present

## 2023-11-27 DIAGNOSIS — Z6841 Body Mass Index (BMI) 40.0 and over, adult: Secondary | ICD-10-CM | POA: Diagnosis not present

## 2023-11-27 DIAGNOSIS — E785 Hyperlipidemia, unspecified: Secondary | ICD-10-CM | POA: Diagnosis not present

## 2023-11-27 DIAGNOSIS — Z1331 Encounter for screening for depression: Secondary | ICD-10-CM | POA: Diagnosis not present

## 2023-11-27 DIAGNOSIS — Z1339 Encounter for screening examination for other mental health and behavioral disorders: Secondary | ICD-10-CM | POA: Diagnosis not present

## 2023-11-27 DIAGNOSIS — Z0000 Encounter for general adult medical examination without abnormal findings: Secondary | ICD-10-CM | POA: Diagnosis not present

## 2023-11-27 DIAGNOSIS — M8580 Other specified disorders of bone density and structure, unspecified site: Secondary | ICD-10-CM | POA: Diagnosis not present

## 2023-12-07 DIAGNOSIS — R002 Palpitations: Secondary | ICD-10-CM | POA: Diagnosis not present

## 2023-12-13 ENCOUNTER — Ambulatory Visit

## 2023-12-17 ENCOUNTER — Encounter: Payer: Self-pay | Admitting: Cardiology

## 2023-12-26 DIAGNOSIS — R079 Chest pain, unspecified: Secondary | ICD-10-CM | POA: Diagnosis not present

## 2023-12-27 DIAGNOSIS — R9431 Abnormal electrocardiogram [ECG] [EKG]: Secondary | ICD-10-CM | POA: Diagnosis not present

## 2023-12-27 DIAGNOSIS — I4891 Unspecified atrial fibrillation: Secondary | ICD-10-CM | POA: Diagnosis not present

## 2023-12-27 DIAGNOSIS — I444 Left anterior fascicular block: Secondary | ICD-10-CM | POA: Diagnosis not present

## 2024-01-08 ENCOUNTER — Ambulatory Visit
